# Patient Record
Sex: Female | Born: 1975 | Race: White | Hispanic: No | Marital: Married | Smoking: Never smoker
Health system: Southern US, Academic
[De-identification: ages and names within clinical notes are randomized; demographics above are authoritative.]

## PROBLEM LIST (undated history)

## (undated) DIAGNOSIS — C189 Malignant neoplasm of colon, unspecified: Secondary | ICD-10-CM

## (undated) DIAGNOSIS — I4891 Unspecified atrial fibrillation: Secondary | ICD-10-CM

## (undated) HISTORY — PX: HX HEMICOLECTOMY: 2100001145

## (undated) HISTORY — DX: Malignant neoplasm of colon, unspecified: C18.9

## (undated) HISTORY — PX: HX COLONOSCOPY: 2100001147

## (undated) HISTORY — DX: Unspecified atrial fibrillation: I48.91

## (undated) HISTORY — PX: DILATION AND CURETTAGE, DIAGNOSTIC / THERAPEUTIC: SUR384

## (undated) HISTORY — PX: HX BACK SURGERY: SHX140

---

## 1998-09-02 ENCOUNTER — Other Ambulatory Visit (HOSPITAL_COMMUNITY): Payer: Self-pay

## 2021-01-07 IMAGING — CT CT THORAX WITH CONTRAST
2 of 3 series · 16 of 36 positions shown, 20 images · IV contrast (CONTRAST)
Comparison: 01/06/2018

﻿EXAM:  [id number]   CT THORAX WITH CONTRAST
INDICATION: Lung nodule.
TECHNIQUE: CT of the chest was performed with 75 mL Optiray 320.   Exam was performed using one or more of the following dose reduction techniques: Automated exposure control, adjustment of the mA and/or kV according to patient size, or the use of iterative reconstruction technique. Radiation dose 591 mGy cm.

[Series 8028: ax post · axial · 0.72mm/px · z∈[-379,-323]mm · 13 of 33 slices shown, 17 images]
[im 3/33  mediastinal]
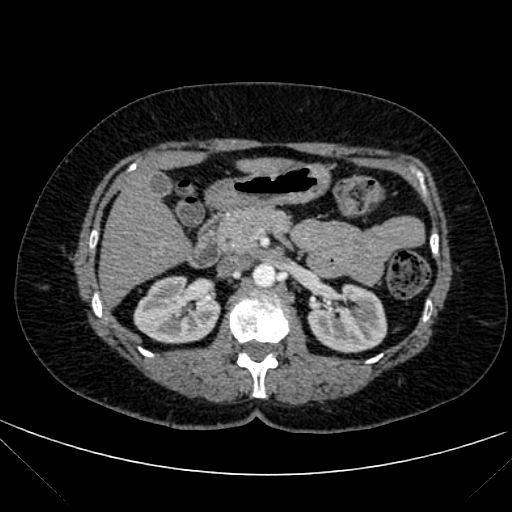
[im 3/33  lung]
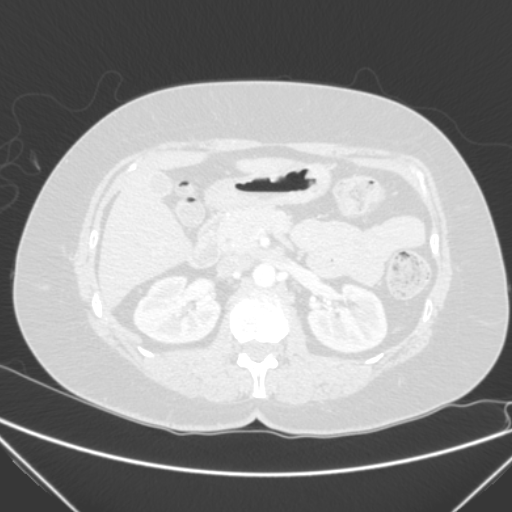
[im 5/33  lung]
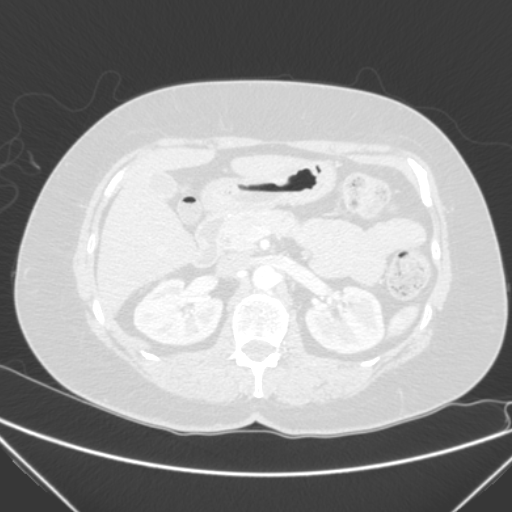
[im 8/33  lung]
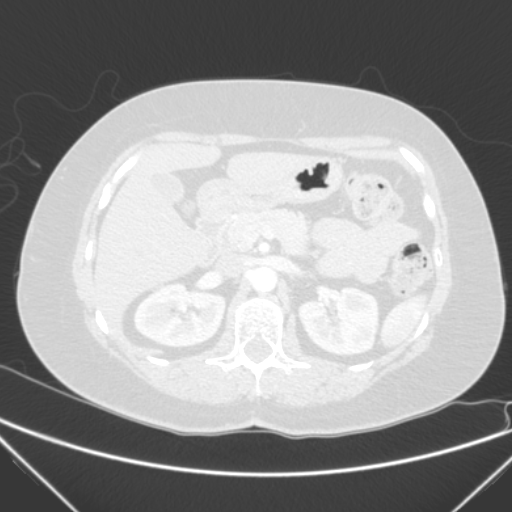
[im 10/33  lung]
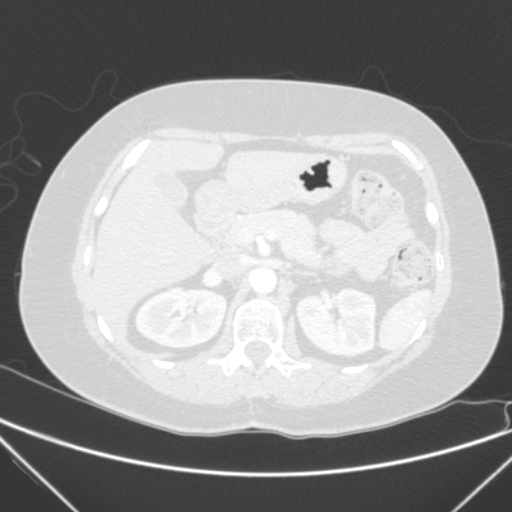
[im 12/33  mediastinal]
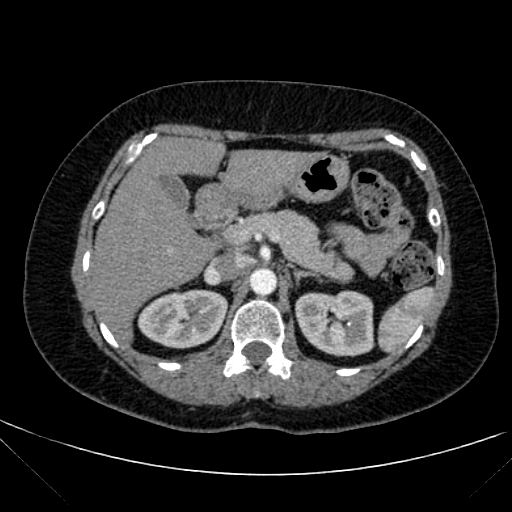
[im 12/33  lung]
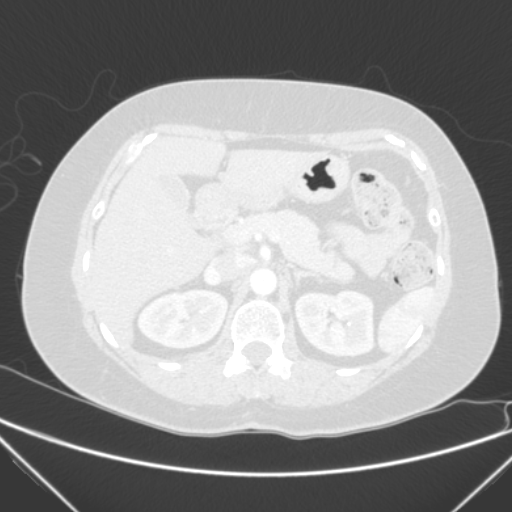
[im 15/33  lung]
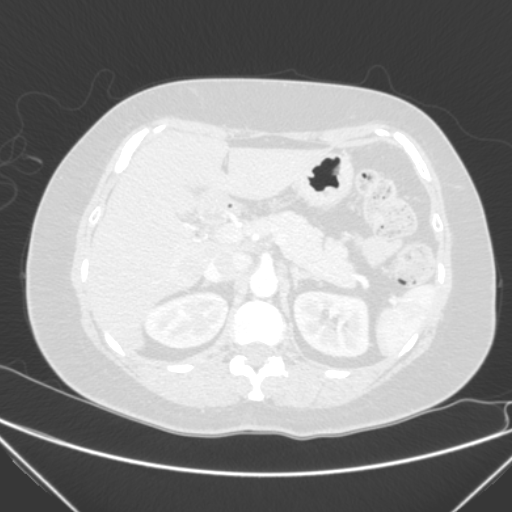
[im 17/33  lung]
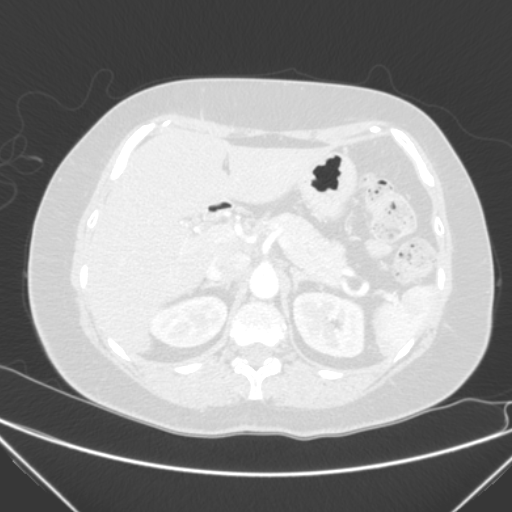
[im 19/33  lung]
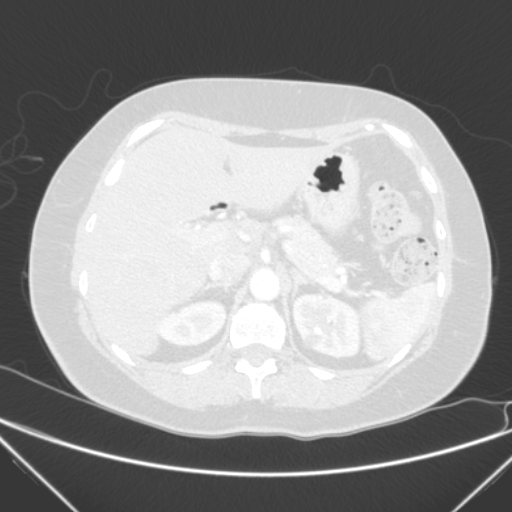
[im 22/33  mediastinal]
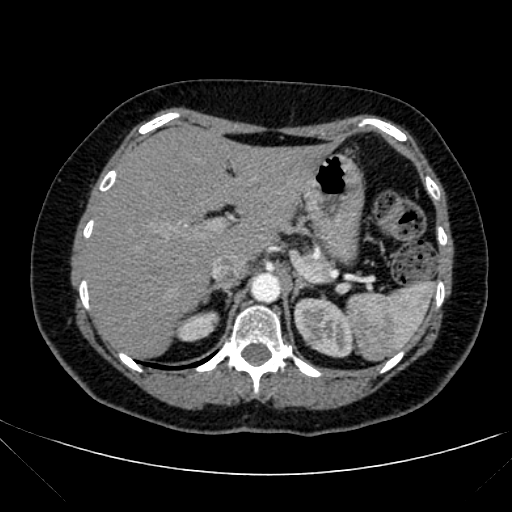
[im 22/33  lung]
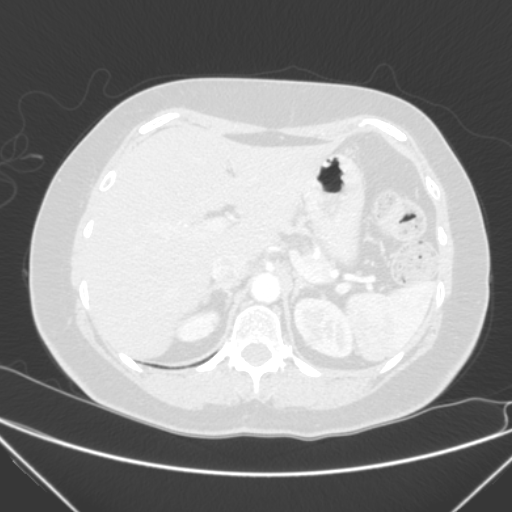
[im 24/33  lung]
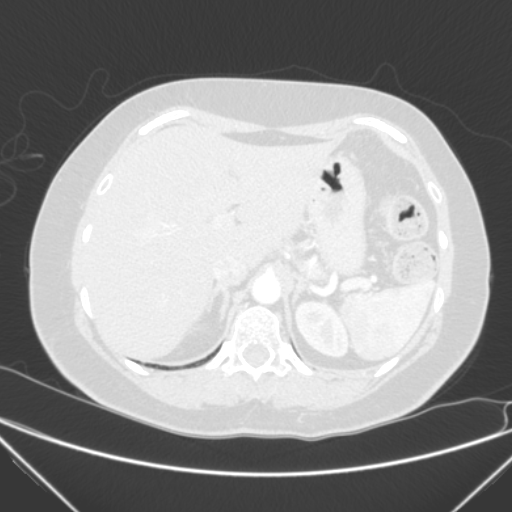
[im 27/33  lung]
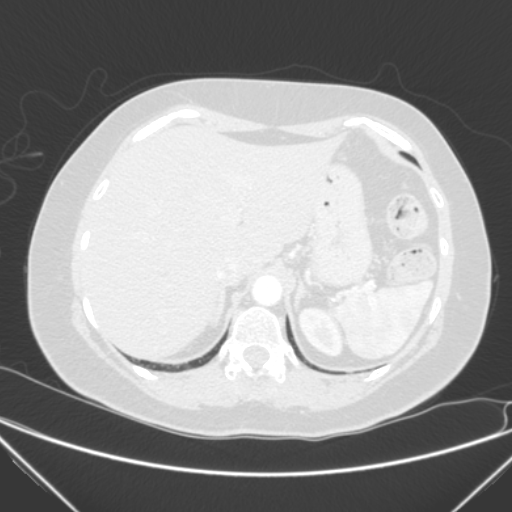
[im 29/33  lung]
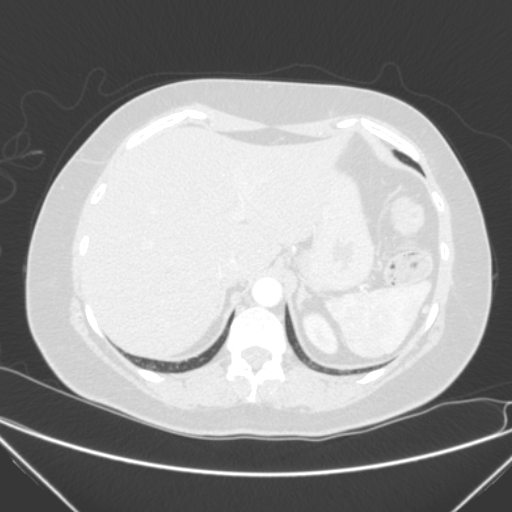
[im 31/33  mediastinal]
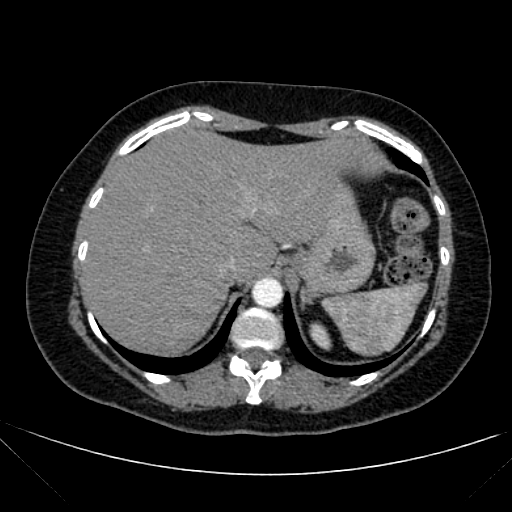
[im 31/33  lung]
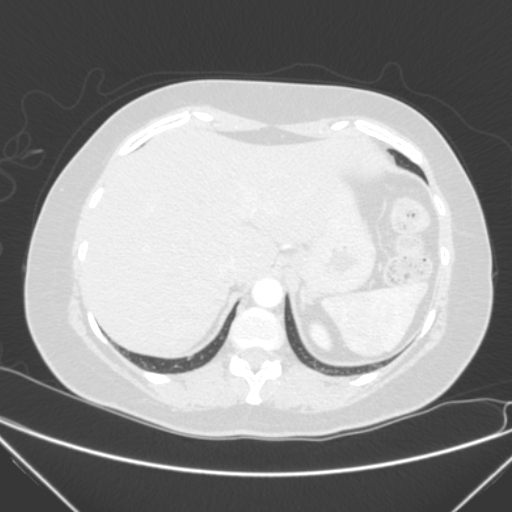

[Series 8029: cor · coronal · 0.71mm/px · 3 of 79 slices shown]
[im 16/79  lung]
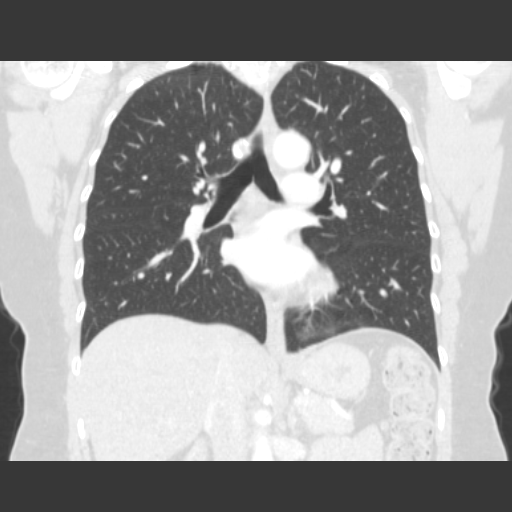
[im 32/79  lung]
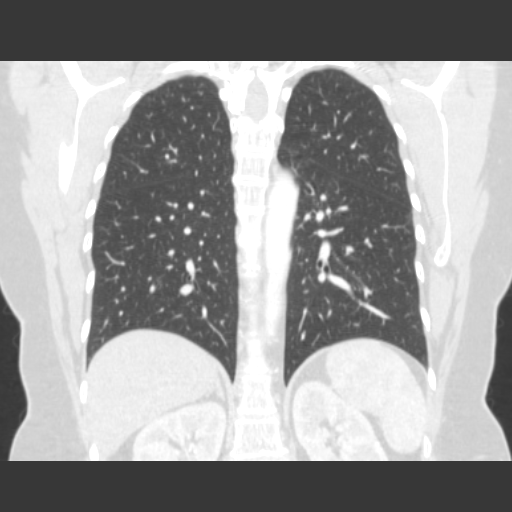
[im 47/79  lung]
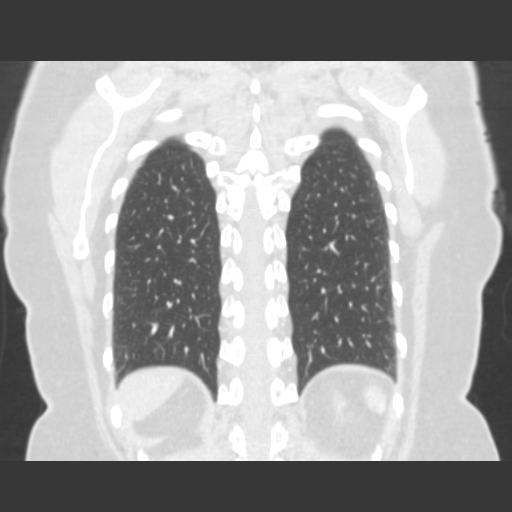

[16 of 36 positions shown; findings below may reference images not displayed]

FINDINGS: The lungs appear essentially clear with no infiltrates or significant pulmonary nodules.  

There is no pleural or pericardial fluid.  The heart size is within normal limits.  There is no lymph node enlargement.  The aorta is of normal caliber.
IMPRESSION: Essentially unremarkable examination.

## 2021-07-02 IMAGING — MR MRI LUMBAR SPINE WITHOUT CONTRAST
4 of 6 series · 31 of 48 positions shown · IV contrast (gadolinium)
Comparison: CT abdomen pelvis dated 01/06/2018.

﻿EXAM:  [id number]   MRI LUMBAR SPINE WITHOUT CONTRAST
INDICATION: Lower back pain with right lower extremity radiculopathy.
TECHNIQUE: Multiplanar multisequential MRI of the lumbar spine was performed without gadolinium contrast.

[Series 5: T2 · sagittal · 4.0mm · 0.94mm/px · 6 of 13 slices shown (1 of 3)]
[im 1/13]
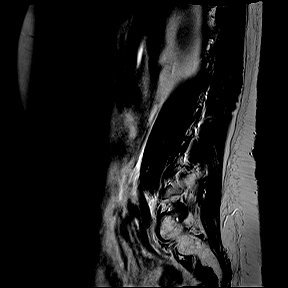
[im 3/13]
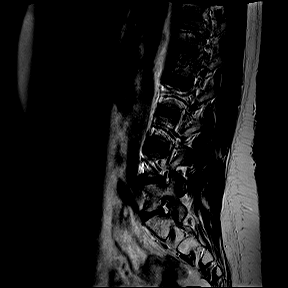
[im 5/13]
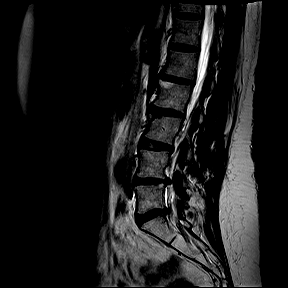
[im 8/13]
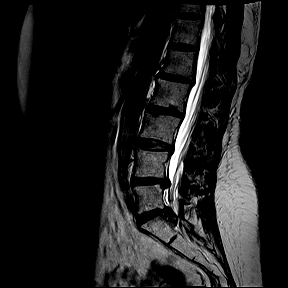
[im 10/13]
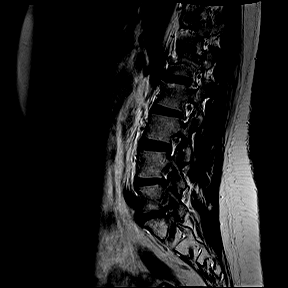
[im 13/13]
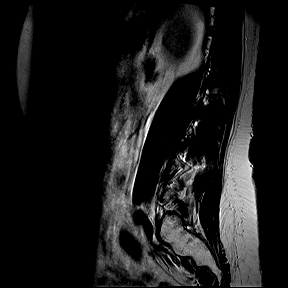

[Series 6: T1 · sagittal · 4.0mm · 0.94mm/px · 6 of 13 slices shown]
[im 1/13]
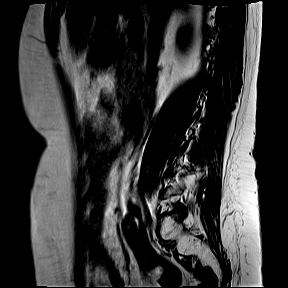
[im 3/13]
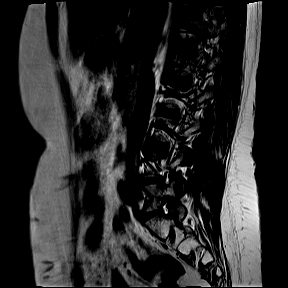
[im 5/13]
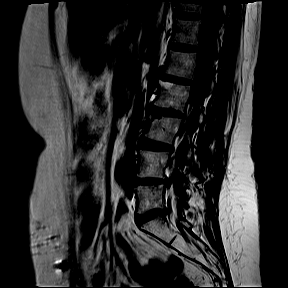
[im 8/13]
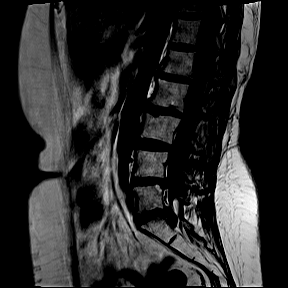
[im 10/13]
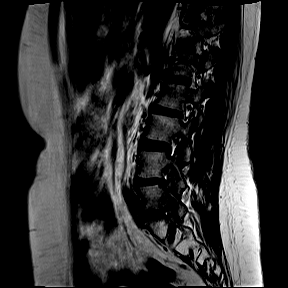
[im 13/13]
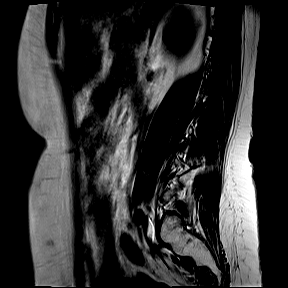

[Series 8: T2 · axial · 4.0mm · 0.52mm/px · z∈[-65,+131]mm · 11 of 26 slices shown (2 of 3)]
[im 1/26]
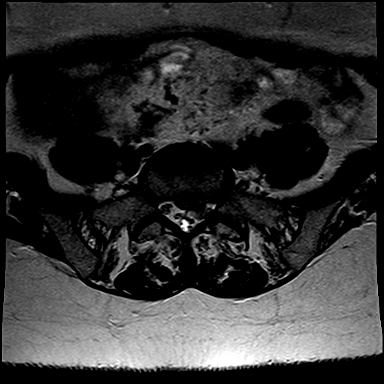
[im 3/26]
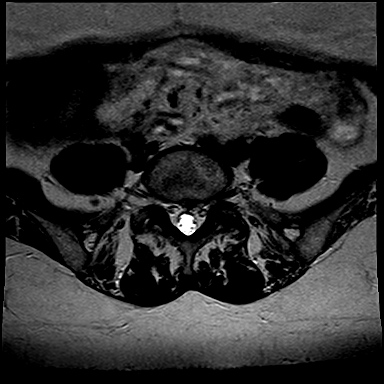
[im 6/26]
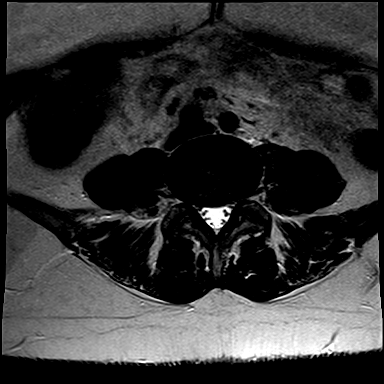
[im 8/26]
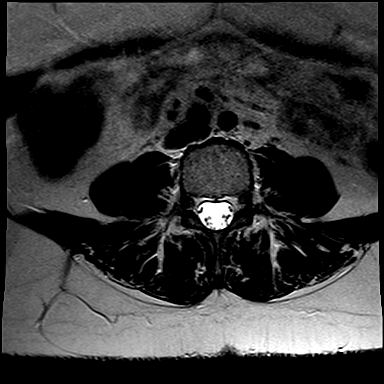
[im 11/26]
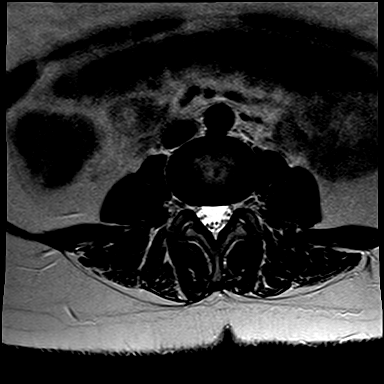
[im 13/26]
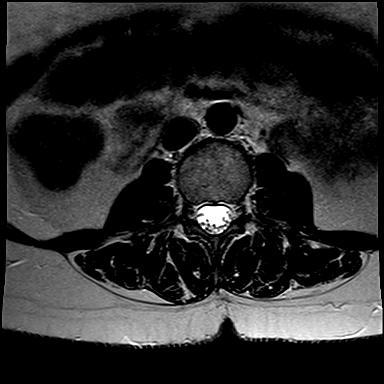
[im 16/26]
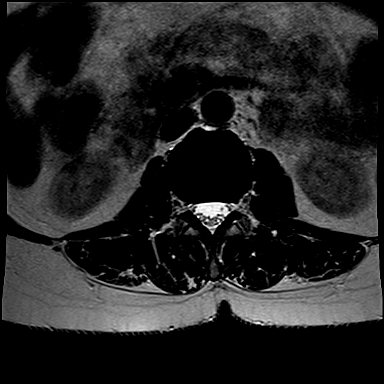
[im 18/26]
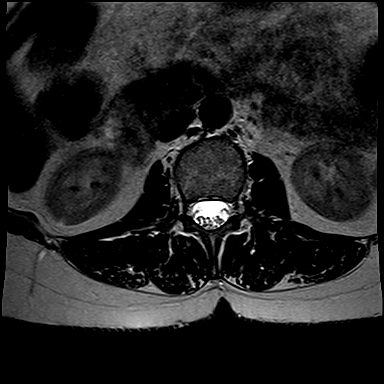
[im 21/26]
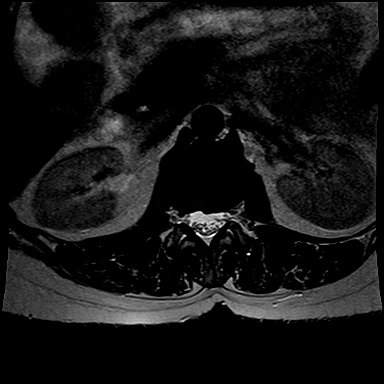
[im 23/26]
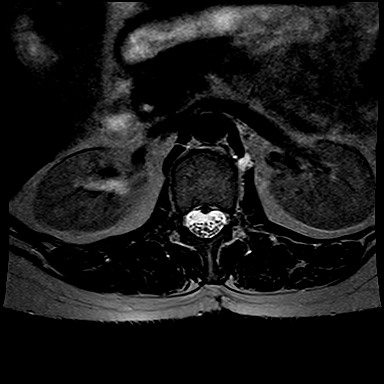
[im 26/26]
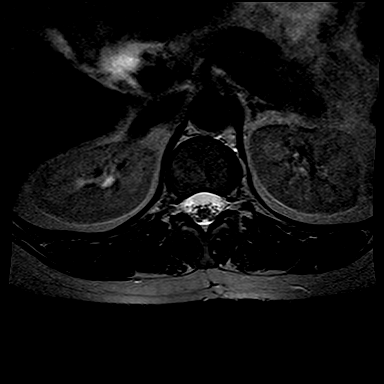

[Series 10: T2 · coronal · 5.0mm · 0.82mm/px · 8 of 18 slices shown (3 of 3)]
[im 1/18]
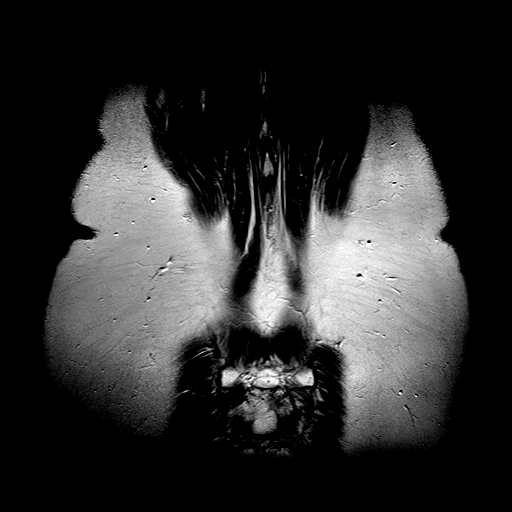
[im 3/18]
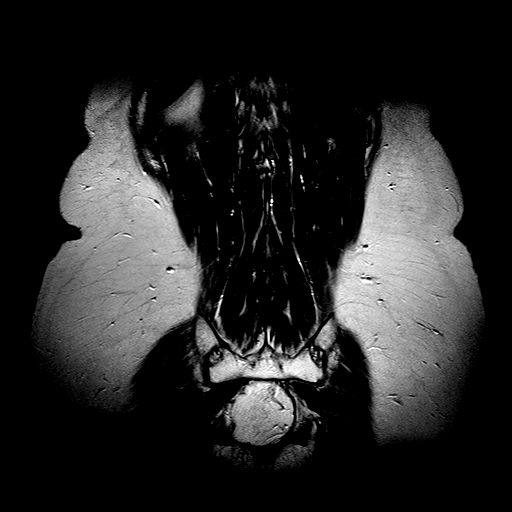
[im 5/18]
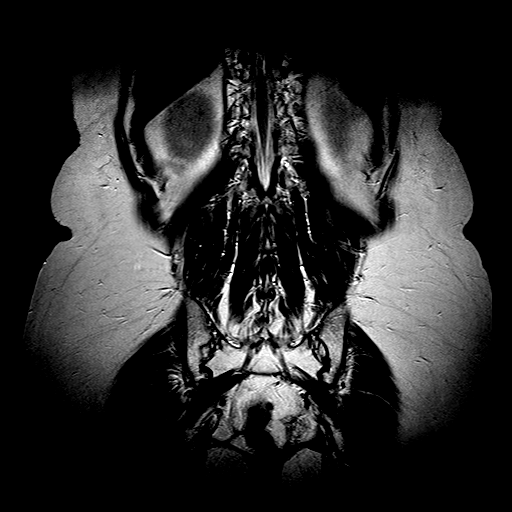
[im 8/18]
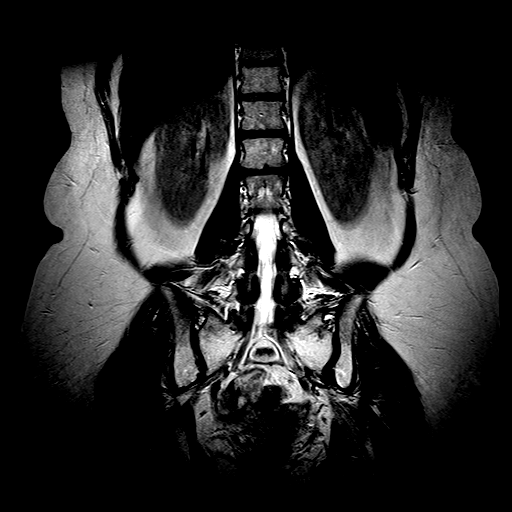
[im 10/18]
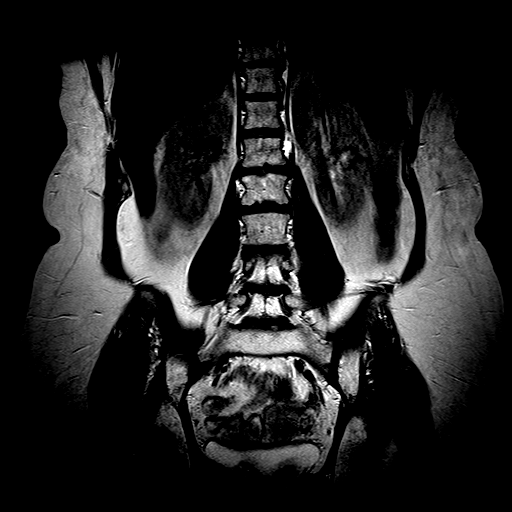
[im 13/18]
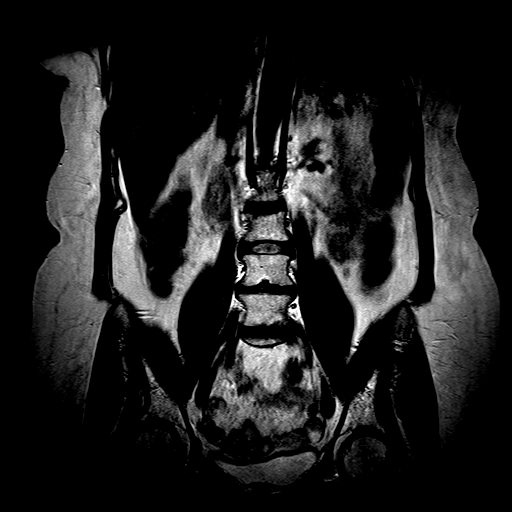
[im 15/18]
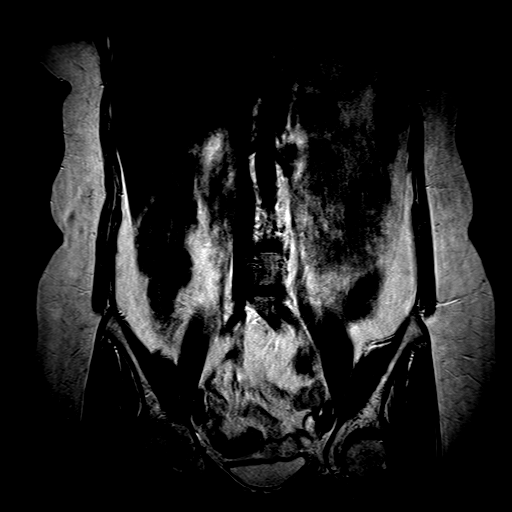
[im 18/18]
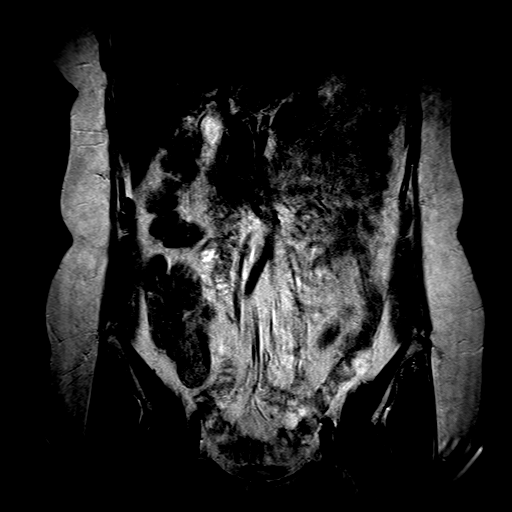

[31 of 48 positions shown; findings below may reference images not displayed]

FINDINGS: Bone marrow signal intensity is normal. There is no acute fracture or subluxation. Distal spinal cord is normal in signal intensity and terminates normally at T12-L1 disc space level. Spinal canal is congenitally narrow.

At L1-2 level, there is a small broad-based central and left paracentral disc bulge, mildly effacing the ventral thecal sac. There is no significant neural foraminal stenosis.

L2-3 level is unremarkable.

At L3-4 level, there is a small broad-based central disc bulge, mildly effacing the ventral thecal sac. There is mild bilateral neural foraminal stenosis from facet arthropathy and bulging annulus.

At L4-5 level, there is minimal retrolisthesis of L4 on L5 vertebral body. There is a small broad-based central disc bulge, mildly effacing the ventral thecal sac. There is moderate bilateral neural foraminal stenosis from facet arthropathy and bulging annulus.

At L5-S1 level, there is a moderate broad-based central and left paracentral disc bulge resulting in moderate left lateral recess compression. There is no mass effect on the thecal sac. There is also a 10 mm left paracentral extruded disc fragment extending inferiorly from the disc space and resulting in severe compression of the left S1 nerve root.

Paraspinal soft tissues are unremarkable.
IMPRESSION: 1. Minimal retrolisthesis of L4 on L5 vertebral body. 

2. Moderate left lateral recess compression at L5-S1 level from a moderate central and left paracentral disc bulge. A 10 mm left paracentral extruded disc fragment is also seen extending inferiorly at this level and resulting in severe left S1 nerve root compression. 

3. Multilevel neural foraminal stenosis as detailed above.

## 2021-09-25 NOTE — H&P (Signed)
Shirley Hudson  [id number]  1976/10/08   09/28/2021       Department of Hematology/Oncology  Return Patient Visit           REFERRING PROVIDER:  Loran Senters, MD  No address on file      REASON FOR OFFICE VISIT:  Ongoing management and evaluation of  Colon cancer      HISTORY OF PRESENT ILLNESS:  Shirley Hudson is a 45 y.o. female who presents to today alone for evaluation of colon cancer.  Review of Dr. Pierre Bali initial office consultation note dated April 01, 2016 reveals that the patient has been having intermittent hematochezia approximally for 2 years prior to diagnosis.  She was initially felt that this was secondary to hemorrhoids from the birth of her last child.  Unfortunately over the 6 months prior to diagnosis the bleeding became more frequent with greater volume.  Colonoscopy was performed and a polyp was noted in the rectum.  Biopsy revealed adeno carcinoma.  There was no evidence for metastatic disease.  She went for resection of disease on Mar 04, 2016 and underwent a low anterior resection.  Three to cancer was a T1 lesion and measured approximately 2.2 cm in size.  Was an adeno carcinoma and felt to be low grade.  Seventeen lymph nodes were removed and 1 contain metastatic cancer.  T1N1aM0 lesion.  Patient was treated with adjuvant FOLFOX therapy.  Patient had an abnormal mammogram and then had a Ultrasound of the lesion.  She states that it looked ok but this was through radiologist.  She continues to have issues with pain in the right breast and then pain in the right axilla.      She also had back surgery sent she has seen me.  She had an MRI and it showed a Fragment of a disc on the nerve.  She had surgery in [place] to remove this.   She has numbness in the left foot and the  Left upper thigh.   She follow up with the surgeon this month.   Denies fever, chills, diaphoresis, night sweats, and malaise. No infections during the interim.     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    HENT:  Negative.    Eyes: Negative.    Respiratory: Negative.    Cardiovascular: Negative.    Gastrointestinal: Negative.    Endocrine: Negative.    Genitourinary: Negative.     Musculoskeletal: Negative.    Skin: Negative.    Neurological: Negative.    Hematological: Negative.    Psychiatric/Behavioral: Negative.         Past Medical History:   Diagnosis Date    Afib (CMS HCC)     Malignant neoplasm of colon (CMS HCC)            Social History     Socioeconomic History    Marital status: Married     Spouse name: Not on file    Number of children: Not on file    Years of education: Not on file    Highest education level: Not on file   Occupational History    Not on file  Tobacco Use    Smoking status: Never    Smokeless tobacco: Never   Vaping Use    Vaping Use: Never used   Substance and Sexual Activity    Alcohol use: Never    Drug use: Never    Sexual activity: Not on file   Other Topics Concern    Not on file   Social History Narrative    Not on file     Social Determinants of Health     Financial Resource Strain: Not on file   Food Insecurity: Not on file   Transportation Needs: Not on file   Physical Activity: Not on file   Stress: Not on file   Intimate Partner Violence: Not on file   Housing Stability: Not on file       Social History     Social History Narrative    Not on file       Social History     Substance and Sexual Activity   Drug Use Never       Family Medical History:     Problem Relation (Age of Onset)    Lung Cancer Father, Maternal Grandfather    Throat cancer Paternal Grandfather            Current Outpatient Medications   Medication Sig    ALPRAZolam (XANAX) 0.5 mg Oral Tablet     bethanechol  chloride (URECHOLINE) 5 mg Oral Tablet     busPIRone (BUSPAR) 10 mg Oral Tablet     cetirizine (ZYRTEC) 10 mg Oral Tablet     cyanocobalamin (VITAMIN B12) 1,000 mcg/mL Injection Solution     Epinastine 0.05 % Ophthalmic Drops     ergocalciferol, vitamin D2, (DRISDOL) 1,250 mcg (50,000 unit) Oral Capsule     famotidine (PEPCID) 40 mg Oral Tablet     fluticasone propionate (FLONASE) 50 mcg/actuation Nasal Spray, Suspension     gabapentin (NEURONTIN) 100 mg Oral Capsule     HYDROcodone-acetaminophen (NORCO) 5-325 mg Oral Tablet     LINZESS 145 mcg Oral Capsule     meloxicam (MOBIC) 7.5 mg Oral Tablet     methocarbamoL (ROBAXIN) 500 mg Oral Tablet     metoprolol succinate (TOPROL-XL) 25 mg Oral Tablet Sustained Release 24 hr     montelukast (SINGULAIR) 10 mg Oral Tablet     pravastatin (PRAVACHOL) 20 mg Oral Tablet     traMADoL (ULTRAM) 50 mg Oral Tablet        No Known Allergies      PHYSICAL EXAM:  BP 137/66 (Site: Left, Patient Position: Sitting, Cuff Size: Adult)    Pulse 75    Temp 36.9 C (98.4 F) (Oral)    Ht 1.676 m (5\' 6" )    Wt 75.3 kg (166 lb)    BMI 26.79 kg/m        ECOG Status: (0) Fully active, able to carry on all predisease performance without restriction   Physical Exam  Vitals reviewed.   Constitutional:       Appearance: Normal appearance. She is normal weight.   HENT:      Head: Normocephalic.   Eyes:      Conjunctiva/sclera: Conjunctivae normal.      Pupils: Pupils are equal, round, and reactive to light.   Neck:      Thyroid: No thyroid mass, thyromegaly or thyroid tenderness.   Cardiovascular:      Rate and Rhythm: Normal rate and regular rhythm.      Pulses: Normal pulses.  Heart sounds: Normal heart sounds, S1 normal and S2 normal. No murmur heard.    No S3 or S4 sounds.   Pulmonary:      Effort: Pulmonary effort is normal.      Breath sounds: Normal breath sounds.   Abdominal:      General: Bowel sounds are normal.      Palpations: Abdomen is soft.   Musculoskeletal:          General: Normal range of motion.      Cervical back: Normal range of motion and neck supple.      Right lower leg: No edema.      Left lower leg: No edema.   Lymphadenopathy:      Head:      Right side of head: No submental, submandibular, preauricular, posterior auricular or occipital adenopathy.      Left side of head: No submental, submandibular, preauricular, posterior auricular or occipital adenopathy.      Cervical: No cervical adenopathy.      Right cervical: No superficial, deep or posterior cervical adenopathy.     Left cervical: No superficial, deep or posterior cervical adenopathy.      Upper Body:      Right upper body: No supraclavicular or axillary adenopathy.      Left upper body: No supraclavicular or axillary adenopathy.      Lower Body: No right inguinal adenopathy. No left inguinal adenopathy.   Skin:     General: Skin is warm and dry.      Capillary Refill: Capillary refill takes less than 2 seconds.   Neurological:      General: No focal deficit present.      Mental Status: She is alert and oriented to person, place, and time. Mental status is at baseline.      Motor: Motor function is intact.      Coordination: Coordination is intact.      Gait: Gait is intact.   Psychiatric:         Attention and Perception: Attention and perception normal.         Mood and Affect: Mood and affect normal.         Speech: Speech normal.         Behavior: Behavior normal. Behavior is cooperative.         Thought Content: Thought content normal.         Cognition and Memory: Cognition and memory normal.         Judgment: Judgment normal.            RADIOLOGY:  06/08/21 mammogram somewhat spiculated mass in the left lower inner retro areolar region.  There is also a circumscribed benign appearing right 6 o'clock mid depth nodule new from previous    06/17/21 left breast ultrasound shows a probable benign 5 mm parallel nodule at 8 o'clock 6 cm from the nipple    LABS:   04/30/21  WBC 5.9, Hgb 12.7, Hct 39.5, Plt  270,000  CEA 1.3    ASSESSMENT:    ICD-10-CM    1. Rectal cancer (CMS HCC)  C20 CBC/DIFF     COMPREHENSIVE METABOLIC PANEL, NON-FASTING     CARCINOEMBRYONIC ANTIGEN     Refer to Gen Surg-Dr. Mallie Mussel      2. Abnormal mammogram of both breasts  R92.8            PLAN:   1. All relative external and internal medical records were reviewed from Dr.  Schor's office including available H&Ps, progress notes, procedure notes, imaging's, laboratories, and pathology.  2. Patient was given lab order today for CBC, CMP, and CEA.  Given her history of colon cancer and this abnormal mammogram patient would like to see Dr. Mallie Mussel due to the abnormal mammogram.  I will make this referral.  I will see her in 3 months sooner if needed  Return in about 3 months (around 12/27/2021).     Shirley Hudson was given the chance to ask questions, and these were answered to their satisfaction. The patient is welcome to call with any questions or concerns in the meantime.     On the day of the encounter, I spent a total of  31 minutes on this patient encounter including review of historical information, examination, documentation and post-visit activities.     Patrcia Dolly, APRN,FNP-BC  09/25/2021, 12:28     This note was partially generated using MModal Fluency Direct system, and there may be some incorrect words, spellings, and punctuation that were not noted in checking the note before saving.     CC:  Zadie Rhine, [place]  [place] [address]

## 2021-09-28 ENCOUNTER — Ambulatory Visit (INDEPENDENT_AMBULATORY_CARE_PROVIDER_SITE_OTHER): Payer: Medicare Other | Admitting: NURSE PRACTITIONER

## 2021-09-28 ENCOUNTER — Encounter (INDEPENDENT_AMBULATORY_CARE_PROVIDER_SITE_OTHER): Payer: Self-pay | Admitting: NURSE PRACTITIONER

## 2021-09-28 ENCOUNTER — Other Ambulatory Visit: Payer: Self-pay

## 2021-09-28 VITALS — BP 137/66 | HR 75 | Temp 98.4°F | Ht 66.0 in | Wt 166.0 lb

## 2021-09-28 DIAGNOSIS — C20 Malignant neoplasm of rectum: Secondary | ICD-10-CM

## 2021-09-28 DIAGNOSIS — R928 Other abnormal and inconclusive findings on diagnostic imaging of breast: Secondary | ICD-10-CM

## 2021-09-28 NOTE — Patient Instructions (Signed)
Please take the labs orders given today and go to the first floor registration.  Once registered you will be directed to the lab for completion.  If you choose you may take your labs to [facility] as well.  Please be aware that it does take longer for me to receive results from [place].  We will call you if needed once your labs are received. I encourage you to keep all your scheduled appointment with physicians.  Mammograms are recommended yearly.  Colonoscopy is recommended starting at age 52 unless symptoms for family history warrant sooner.  Patient with colon cancer screening is based off of when initial diagnosis was and most recent colonoscopy findings.  Immunization recommendations are based on age.   I look forward to seeing you for your next follow up in 3 months.  Please call if you have any questions or concerns.      If you have not already signed up for  [place], I strongly encourage you to do so.   It will allow to you message [place] with questions, schedule and reschedule appointments. Once the hospital goes to [facility] in March of 2023 it will allow you to see your labs and imaging as well.  If you have not given [place] your e-mail address and you want to sign up for this service, please give check in or check out your e-mail.  You will receive an e-mail and a link to sign up and then will need to download the app on your phone.    Our office number is [phone].  You will be directed to the call center and then a message will be sent to our office.  If you are awaiting a call back from [place] please make sure to answer in toll free call numbers, as that is currently what the phone ID shows when we call from our office.      Please be aware that when you are here in the office and we are a [facility] facility.  The hospital is not  a [facility] facility at this time and if you need a port flush or treatment you will have to be registered and scheduled with [facility].    If you are on the [facility] outpatient Oncology side for treatment or  port flush and have not seen the provider on that day, we do not know that you are here, as much as we would like to keep up with everyone we just cannot, and [facility] schedule is not our schedule.  If you have a problem while you are in the infusion center you will need to speak with the nurse who is caring for you. They will get in touch with [place].  Please do not stop [place] in the hall, as we are trying to see patients in a timely manner.      Thank you for allowing [place] to be part of your healthcare team.

## 2021-10-07 ENCOUNTER — Encounter (INDEPENDENT_AMBULATORY_CARE_PROVIDER_SITE_OTHER): Payer: Self-pay | Admitting: NURSE PRACTITIONER

## 2021-10-14 ENCOUNTER — Encounter (INDEPENDENT_AMBULATORY_CARE_PROVIDER_SITE_OTHER): Payer: Self-pay | Admitting: NURSE PRACTITIONER

## 2021-10-14 NOTE — Telephone Encounter (Signed)
Opened in error

## 2021-10-26 ENCOUNTER — Encounter (INDEPENDENT_AMBULATORY_CARE_PROVIDER_SITE_OTHER): Payer: Self-pay | Admitting: NURSE PRACTITIONER

## 2022-01-04 ENCOUNTER — Encounter (INDEPENDENT_AMBULATORY_CARE_PROVIDER_SITE_OTHER): Payer: Self-pay | Admitting: NURSE PRACTITIONER

## 2022-02-18 ENCOUNTER — Other Ambulatory Visit (HOSPITAL_COMMUNITY): Payer: Medicare Other

## 2022-02-18 ENCOUNTER — Other Ambulatory Visit: Payer: Self-pay

## 2022-02-18 ENCOUNTER — Ambulatory Visit: Payer: Medicare Other | Attending: HEMATOLOGY-ONCOLOGY | Admitting: HEMATOLOGY-ONCOLOGY

## 2022-02-18 ENCOUNTER — Encounter (HOSPITAL_COMMUNITY): Payer: Self-pay | Admitting: HEMATOLOGY-ONCOLOGY

## 2022-02-18 VITALS — BP 117/92 | HR 67 | Temp 97.7°F | Ht 66.0 in | Wt 168.1 lb

## 2022-02-18 DIAGNOSIS — C20 Malignant neoplasm of rectum: Secondary | ICD-10-CM

## 2022-02-18 DIAGNOSIS — Z85038 Personal history of other malignant neoplasm of large intestine: Secondary | ICD-10-CM | POA: Insufficient documentation

## 2022-02-18 DIAGNOSIS — Z801 Family history of malignant neoplasm of trachea, bronchus and lung: Secondary | ICD-10-CM | POA: Insufficient documentation

## 2022-02-18 DIAGNOSIS — Z802 Family history of malignant neoplasm of other respiratory and intrathoracic organs: Secondary | ICD-10-CM | POA: Insufficient documentation

## 2022-02-18 DIAGNOSIS — N6489 Other specified disorders of breast: Secondary | ICD-10-CM

## 2022-02-18 DIAGNOSIS — Z08 Encounter for follow-up examination after completed treatment for malignant neoplasm: Secondary | ICD-10-CM | POA: Insufficient documentation

## 2022-02-18 LAB — COMPREHENSIVE METABOLIC PANEL, NON-FASTING
ALBUMIN/GLOBULIN RATIO: 1.5 — ABNORMAL HIGH (ref 0.8–1.4)
ALBUMIN: 4.7 g/dL (ref 3.5–5.7)
ALKALINE PHOSPHATASE: 85 U/L (ref 34–104)
ALT (SGPT): 16 U/L (ref 7–52)
ANION GAP: 8 mmol/L — ABNORMAL LOW (ref 10–20)
AST (SGOT): 18 U/L (ref 13–39)
BILIRUBIN TOTAL: 0.6 mg/dL (ref 0.3–1.2)
BUN/CREA RATIO: 16 (ref 6–22)
BUN: 12 mg/dL (ref 7–25)
CALCIUM, CORRECTED: 9.4 mg/dL (ref 8.9–10.8)
CALCIUM: 10.1 mg/dL (ref 8.6–10.3)
CHLORIDE: 103 mmol/L (ref 98–107)
CO2 TOTAL: 29 mmol/L (ref 21–31)
CREATININE: 0.75 mg/dL (ref 0.60–1.30)
ESTIMATED GFR: 99 mL/min/{1.73_m2} (ref >59)
GLOBULIN: 3.1 (ref 2.9–5.4)
GLUCOSE: 90 mg/dL (ref 74–109)
OSMOLALITY, CALCULATED: 279 mOsm/kg (ref 270–290)
POTASSIUM: 4.2 mmol/L (ref 3.5–5.1)
PROTEIN TOTAL: 7.8 g/dL (ref 6.4–8.9)
SODIUM: 140 mmol/L (ref 136–145)

## 2022-02-18 LAB — CBC WITH DIFF
BASOPHIL #: 0.1 10*3/uL (ref 0.00–0.30)
BASOPHIL %: 1 % (ref 0–3)
EOSINOPHIL #: 0.1 10*3/uL (ref 0.00–0.80)
EOSINOPHIL %: 3 % (ref 0–7)
HCT: 39.3 % (ref 37.0–47.0)
HGB: 13.4 g/dL (ref 12.5–16.0)
LYMPHOCYTE #: 1.5 10*3/uL (ref 1.10–5.00)
LYMPHOCYTE %: 33 % (ref 25–45)
MCH: 29.2 pg (ref 27.0–32.0)
MCHC: 34.2 g/dL (ref 32.0–36.0)
MCV: 85.3 fL (ref 78.0–99.0)
MONOCYTE #: 0.4 10*3/uL (ref 0.00–1.30)
MONOCYTE %: 10 % (ref 0–12)
MPV: 8.2 fL (ref 7.4–10.4)
NEUTROPHIL #: 2.4 10*3/uL (ref 1.80–8.40)
NEUTROPHIL %: 53 % (ref 40–76)
PLATELETS: 307 10*3/uL (ref 140–440)
RBC: 4.61 10*6/uL (ref 4.20–5.40)
RDW: 13.2 % (ref 11.6–14.8)
WBC: 4.6 10*3/uL (ref 4.0–10.5)
WBCS UNCORRECTED: 4.6 10*3/uL

## 2022-02-18 LAB — CARCINOEMBRYONIC ANTIGEN: CEA: 1 ng/mL (ref <=5.0)

## 2022-02-18 NOTE — Patient Instructions (Signed)
Continue current medications.     Lab orders placed for today.

## 2022-02-18 NOTE — Progress Notes (Addendum)
Department of Hematology/Oncology  Progress Note   Name: Shirley Hudson  FVC:[id number]  Date of Birth: 05/15/1976  Encounter Date: 02/18/2022    REFERRING PROVIDER:  Zadie Rhine, [address]  [place],  [place] [address]     TELEMEDICINE DOCUMENTATION:  Patient Location:  [facility], [facility] outpatient Hematology/Oncology [address], [place] [address]  Patient/family aware of provider location:  yes  Patient/family consent for telemedicine:  yes  Examination observed and performed by:  Patrcia Dolly APRN, FNP-BC, AOCNP    REASON FOR OFFICE VISIT:  Rectal Cancer     HISTORY OF PRESENT ILLNESS:  Shirley Hudson is a 46 y.o. female who presents today for follow up of rectal cancer. She was originally diagnosed in 2017, and states that hematochezia had been present for approximately the prior 2 years. In May 2017 she underwent an LAR surgery and was found to have a 2.2 cm primary lesion. 17 lymph nodes were removed and 1 was found to be involved. She was therefore stage III (T1N1aM0) and received adjuvant FOLFOX chemotherapy.     She has also had a couple of breast lesions that have been monitored. She states that there has been some discussion about whether to biopsy at least one of them. She says that she has follow up in the near future for that issue.     ROS:   Pertinent review of systems as discussed in HPI    HISTORY:  Past Medical History:   Diagnosis Date    Afib (CMS HCC)     Malignant neoplasm of colon (CMS HCC)          Past Surgical History:   Procedure Laterality Date    CESAREAN SECTION      DILATION AND CURETTAGE, DIAGNOSTIC / THERAPEUTIC      HX BACK SURGERY      HX COLONOSCOPY      HX HEMICOLECTOMY           Social History     Socioeconomic History    Marital status: Married     Spouse name: Not on file    Number of children: Not on file    Years of education: Not on file    Highest education level: Not on file   Occupational History    Not on file   Tobacco Use    Smoking  status: Never    Smokeless tobacco: Never   Vaping Use    Vaping Use: Never used   Substance and Sexual Activity    Alcohol use: Never    Drug use: Never    Sexual activity: Not on file   Other Topics Concern    Not on file   Social History Narrative    Not on file     Social Determinants of Health     Financial Resource Strain: Not on file   Transportation Needs: Not on file   Social Connections: Not on file   Intimate Partner Violence: Not on file   Housing Stability: Not on file     Family Medical History:       Problem Relation (Age of Onset)    Lung Cancer Father, Maternal Grandfather    Throat cancer Paternal Grandfather            Current Outpatient Medications   Medication Sig    albuterol sulfate (PROVENTIL OR VENTOLIN OR PROAIR) 90 mcg/actuation Inhalation oral inhaler ProAir HFA 90 mcg/actuation aerosol inhaler  ALPRAZolam (XANAX) 0.5 mg Oral Tablet     AUVELITY 45-105 mg Oral tablet, IR & ER, biphasic     bethanechol chloride (URECHOLINE) 5 mg Oral Tablet     busPIRone (BUSPAR) 10 mg Oral Tablet     cetirizine (ZYRTEC) 10 mg Oral Tablet     cyanocobalamin (VITAMIN B12) 1,000 mcg/mL Injection Solution     Epinastine 0.05 % Ophthalmic Drops     ergocalciferol, vitamin D2, (DRISDOL) 1,250 mcg (50,000 unit) Oral Capsule     esomeprazole magnesium (NEXIUM) 40 mg Oral Capsule, Delayed Release(E.C.)     famotidine (PEPCID) 40 mg Oral Tablet     fluticasone propionate (FLONASE) 50 mcg/actuation Nasal Spray, Suspension     gabapentin (NEURONTIN) 300 mg Oral Capsule     HYDROcodone-acetaminophen (NORCO) 5-325 mg Oral Tablet     LINZESS 145 mcg Oral Capsule     meloxicam (MOBIC) 7.5 mg Oral Tablet     methocarbamoL (ROBAXIN) 500 mg Oral Tablet     metoprolol succinate (TOPROL-XL) 25 mg Oral Tablet Sustained Release 24 hr     montelukast (SINGULAIR) 10 mg Oral Tablet     olopatadine (PATANOL) 0.1 % ophthalmic solution olopatadine 0.1 % eye drops    pantoprazole (PROTONIX) 40 mg Oral Tablet, Delayed Release (E.C.)  pantoprazole 40 mg tablet,delayed release    pravastatin (PRAVACHOL) 20 mg Oral Tablet     traMADoL (ULTRAM) 50 mg Oral Tablet      No Known Allergies    PHYSICAL EXAM:  Most Recent Vitals    Flowsheet Row Telemedicine from 02/18/2022 in Hematology/Oncology,   [facility]   Temperature 36.5 C (97.7 F) filed at... 02/18/2022 0934   Heart Rate 67 filed at... 02/18/2022 0934   Respiratory Rate --   BP (Non-Invasive) 117/92 filed at... 02/18/2022 0934   SpO2 --   Height 1.676 m ('5\' 6"' ) filed at... 02/18/2022 0934   Weight 76.2 kg (168 lb 1.6 oz) filed at... 02/18/2022 0934   BMI (Calculated) 27.19 filed at... 02/18/2022 0934   BSA (Calculated) 1.88 filed at... 02/18/2022 0934      ECOG Status: (0) Fully active, able to carry on all predisease performance without restriction   Physical Exam  Vitals reviewed.   Constitutional:       Appearance: Normal appearance. She is normal weight.   Neck:      Thyroid: No thyroid mass, thyromegaly or thyroid tenderness.   Cardiovascular:      Rate and Rhythm: Normal rate and regular rhythm.      Pulses: Normal pulses.      Heart sounds: Normal heart sounds, S1 normal and S2 normal. No murmur heard.    No S3 or S4 sounds.   Pulmonary:      Effort: Pulmonary effort is normal.      Breath sounds: Normal breath sounds.   Abdominal:      Palpations: Abdomen is soft.   Musculoskeletal:         General: Normal range of motion.      Cervical back: Normal range of motion.      Right lower leg: No edema.      Left lower leg: No edema.   Lymphadenopathy:      Head:      Right side of head: No submental, submandibular, tonsillar, preauricular, posterior auricular or occipital adenopathy.      Left side of head: No submental, submandibular, tonsillar, preauricular, posterior auricular or occipital adenopathy.      Cervical:  No cervical adenopathy.      Right cervical: No superficial, deep or posterior cervical adenopathy.     Left cervical: No superficial, deep or posterior  cervical adenopathy.      Upper Body:      Right upper body: No supraclavicular or axillary adenopathy.      Left upper body: No supraclavicular or axillary adenopathy.   Skin:     General: Skin is warm.   Neurological:      General: No focal deficit present.      Mental Status: She is alert and oriented to person, place, and time.      Motor: Motor function is intact.      Coordination: Coordination is intact.   Psychiatric:         Attention and Perception: Attention and perception normal.         Mood and Affect: Mood normal.         Speech: Speech normal.         Behavior: Behavior normal. Behavior is cooperative.         Thought Content: Thought content normal.         Cognition and Memory: Cognition normal.         Judgment: Judgment normal.         DIAGNOSTIC DATA:  No results found for this or any previous visit (from the past 17520 hour(s)).    LABS:   CBC  Diff   No results found for: WBC, WBCJ, HGB, HCT, PLTCNT, SEDRATE, ESR, RBC, MCV, MCHC, MCH, RDW, MPV No results found for: PMNS, LYMPHOCYTES, EOSINOPHIL, MONOCYTES, BASOPHILS, PMNABS, LYMPHSABS, EOSABS, MONOSABS, BASOSABS, BASABS         Comprehensive Metabolic Profile    No results found for: SODIUM, POTASSIUM, CHLORIDE, CO2, ANIONGAP, BUN, CREATININE, ALBUMIN, CALCIUM, GLUCOSENF, GLUCOSEFAST, ALKPHOS, ALT, AST, TOTBILIRUBIN, TOTALPROTEIN       BASIC METABOLIC PANEL  No results found for: SODIUM, POTASSIUM, CHLORIDE, CO2, ANIONGAP, BUN, CREATININE, BUNCRRATIO, GFR, CALCIUM, GLUCOSENF        ASSESSMENT:  Problem List Items Addressed This Visit    None  Visit Diagnoses       Rectal cancer (CMS [place])    -  Primary    Relevant Orders    COMPREHENSIVE METABOLIC PANEL, NON-FASTING    CBC/DIFF    CARCINOEMBRYONIC ANTIGEN    COMPREHENSIVE METABOLIC PANEL, NON-FASTING    CBC/DIFF    CARCINOEMBRYONIC ANTIGEN             ICD-10-CM    1. Rectal cancer (CMS HCC)  C20 COMPREHENSIVE METABOLIC PANEL, NON-FASTING     CBC/DIFF     CARCINOEMBRYONIC ANTIGEN      COMPREHENSIVE METABOLIC PANEL, NON-FASTING     CBC/DIFF     CARCINOEMBRYONIC ANTIGEN           PLAN:   1. All relevant medical records were reviewed including available pertinent provider notes, procedure notes, imaging, laboratory, and pathology.   2. All pertinent labs and/or imaging were reviewed with the patient.   3. Rectal cancer: She is more than 5 years out from that diagnosis, but retains a high level of concern. We will check labs today including a CEA level, and continue with 3 month follow ups at her request.  4. Breast lesions: She is following up with radiology for evaluation of that issue. A biopsy might be done in the near future and we may see her back sooner depending on those results.  Shirley Hudson was given the chance to ask questions, and these were answered to their satisfaction. The patient is welcome to call with any questions or concerns in the meantime.     The patient was seen as part of a collaborative telemedicine service with Dr. Jake Shark who participated in the encounter by active presence via approved video/audio means for portions of the encounter.  Dr. Jake Shark and Patrcia Dolly APRN, FNP-BC, AOCNP  discussed the physical assessment, reviewed and interpreted all studies and the plan was mutually developed.     On the day of the encounter,  Patrcia Dolly APRN, FNP-BC, AOCNP spent a total of 10 minutes independently on this patient encounter including review of historical information, face to face patient care, documentation and post-visit activities.     Patrcia Dolly APRN, FNP-BC, AOCNP, 02/18/2022, 10:50      On the day of the encounter, a total of 35 minutes was spent on this patient encounter including review of historical information, examination, documentation and post-visit activities.   Return in about 3 months (around 05/20/2022).     Narda Rutherford, MD  02/18/2022, 10:10    You can see your note(s) in [facility]. It is common for you to encounter certain medical terminology  which may be unfamiliar to you. You might see results before your provider does so please give at least 2 business days for review. Please have this understanding, that NOT all abnormal results are significant. Our office will contact you for any urgent or emergent action if necessary. If you have any questions or concerns, feel free to send a MyChart message or call the office. Please call with any new or concerning symptoms.     CC:  Zadie Rhine, [place] [id number]    Zadie Rhine, [address]  [place],  [place] [address]    This note was partially generated using MModal Fluency Direct system, and there may be some incorrect words, spellings, and punctuation that were not noted in checking the note before saving.

## 2022-02-24 ENCOUNTER — Encounter (HOSPITAL_COMMUNITY): Payer: Self-pay | Admitting: NURSE PRACTITIONER

## 2022-03-04 ENCOUNTER — Inpatient Hospital Stay
Admission: RE | Admit: 2022-03-04 | Discharge: 2022-03-04 | Disposition: A | Discharge status: Still In Hospital | Payer: Medicare Other | Source: Ambulatory Visit | Attending: Radiation Oncology | Admitting: Radiation Oncology

## 2022-03-04 ENCOUNTER — Other Ambulatory Visit: Payer: Self-pay

## 2022-03-04 ENCOUNTER — Encounter (HOSPITAL_COMMUNITY): Payer: Self-pay | Admitting: NURSE PRACTITIONER

## 2022-03-04 NOTE — Progress Notes (Signed)
Name: Shirley Hudson MRN:  [id number]   Date: 03/04/2022 Age: 46 y.o.        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    CHIEF COMPLAINT: Routine follow-up.    INTERVAL ONCOLOGIC HISTORY: I saw her on Mar 05, 2021.  She still complains of constipation.  She has not tolerated Amitiza or Linzess.  No pelvic pain or GI bleeding.    OTHER MEDICAL PROBLEMS: Rapid heart rate, reflux, depression, asthma    PAST MEDICAL HISTORY    ALLERGIES:   No Known Allergies     MEDICATIONS:   Current Outpatient Medications   Medication Instructions   . albuterol sulfate (PROVENTIL OR VENTOLIN OR PROAIR) 90 mcg/actuation Inhalation oral inhaler ProAir HFA 90 mcg/actuation aerosol inhaler   . ALPRAZolam (XANAX) 0.5 mg Oral Tablet No dose, route, or frequency recorded.   . AUVELITY 45-105 mg Oral tablet, IR & ER, biphasic No dose, route, or frequency recorded.   . bethanechol chloride (URECHOLINE) 5 mg Oral Tablet No dose, route, or frequency recorded.   . busPIRone (BUSPAR) 10 mg Oral Tablet No dose, route, or frequency recorded.   . cetirizine (ZYRTEC) 10 mg Oral Tablet No dose, route, or frequency recorded.   . cyanocobalamin (VITAMIN B12) 1,000 mcg/mL Injection Solution No dose, route, or frequency recorded.   Marland Kitchen Epinastine 0.05 % Ophthalmic Drops No dose, route, or frequency recorded.   . ergocalciferol, vitamin D2, (DRISDOL) 1,250 mcg (50,000 unit) Oral Capsule No dose, route, or frequency recorded.   Marland Kitchen esomeprazole magnesium (NEXIUM) 40 mg Oral Capsule, Delayed Release(E.C.) No dose, route, or frequency recorded.   . famotidine (PEPCID) 40 mg Oral Tablet No dose, route, or frequency recorded.   . fluticasone propionate (FLONASE) 50 mcg/actuation Nasal Spray, Suspension No dose, route, or frequency recorded.   . gabapentin (NEURONTIN) 300 mg Oral Capsule No dose,  route, or frequency recorded.   Marland Kitchen HYDROcodone-acetaminophen (NORCO) 5-325 mg Oral Tablet No dose, route, or frequency recorded.   Marland Kitchen LINZESS 145 mcg Oral Capsule No dose, route, or frequency recorded.   . meloxicam (MOBIC) 7.5 mg Oral Tablet No dose, route, or frequency recorded.   . methocarbamoL (ROBAXIN) 500 mg Oral Tablet No dose, route, or frequency recorded.   . metoprolol succinate (TOPROL-XL) 25 mg Oral Tablet Sustained Release 24 hr No dose, route, or frequency recorded.   . montelukast (SINGULAIR) 10 mg Oral Tablet No dose, route, or frequency recorded.   Marland Kitchen olopatadine (PATANOL) 0.1 % ophthalmic solution olopatadine 0.1 % eye drops   . pantoprazole (PROTONIX) 40 mg Oral Tablet, Delayed Release (E.C.) pantoprazole 40 mg tablet,delayed release   . pravastatin (PRAVACHOL) 20 mg Oral Tablet No dose, route, or frequency recorded.   . traMADoL (ULTRAM) 50 mg Oral Tablet No dose, route, or frequency recorded.        INTERVAL SURGERIES:   Past Surgical History:   Procedure Laterality Date   . CESAREAN SECTION     . DILATION AND CURETTAGE, DIAGNOSTIC / THERAPEUTIC     . HX BACK SURGERY     . HX COLONOSCOPY     . HX HEMICOLECTOMY          FAMILY HISTORY: Reviewed and unchanged    SOCIAL HISTORY: Reviewed and unchanged    REVIEW OF SYSTEMS  General: No anorexia,  weight loss, chills, night sweats, or documented fevers.  Endocrine: No diabetes, thyroid problems, corticosteroid use.  Respiratory: No coughing, wheezing, shortness of breath, asthma, emphysema, tuberculosis, black lung, asbestos exposure.  Cardiovascular: No syncope, palpitations, angina, hypertension, hyperlipidemia, myocardial infarction, deep vein thrombosis, history of rheumatic fever.  GI:  Constipation as above.  No dysphagia, abdominal pain, nausea, vomiting, rectal bleeding.  Musculoskeletal: No back pain, joint pain, broken bones.  Psychiatric:  Depression.        PHYSICAL EXAMINATION  General:  She appears well.  Performance Status:  ECOG  on  Vital Signs: There were no vitals taken for this visit.   Skin: No jaundice, urticaria, pigmented lesions, or radiation changes.  Nodes: No preauricular, submental, cervical, supraclavicular, axillary, inguinal lymph nodes felt.  Chest: Good respiratory excursions.  Clear to percussion and auscultation.  Cardiovascular: Neck veins flat.  Carotid pulses strong without bruits.  Rhythm regular, without murmurs, gallops, or rubs.  Abdomen: Nontender, without masses or organomegaly.  Musculoskeletal: No tenderness of the spine, ribs, or pelvis to fist percussion.  Extremities: No clubbing, cyanosis, edema, or arthritic deformities.  Psychiatric: Alert, oriented, and cooperative, with appropriate judgment and affect.      COMMENT: She is clinically disease-free 6 years and 2 months from diagnosis in March 2017.    She was advised to use Senokot regularly and to drink lots of water for constipation.    She would like to continue following up with [place] and we will see her back in the.      Charlotte Crumb, MD       cc:@

## 2022-03-04 NOTE — Addendum Note (Signed)
Encounter addended by: Milas Kocher, RN on: 03/04/2022 1:55 PM   Actions taken: Flowsheet accepted

## 2022-05-20 ENCOUNTER — Ambulatory Visit (HOSPITAL_COMMUNITY): Payer: Self-pay | Admitting: NURSE PRACTITIONER

## 2022-05-27 ENCOUNTER — Other Ambulatory Visit: Payer: Self-pay

## 2022-05-27 ENCOUNTER — Encounter (HOSPITAL_COMMUNITY): Payer: Self-pay | Admitting: HEMATOLOGY-ONCOLOGY

## 2022-05-27 ENCOUNTER — Other Ambulatory Visit (HOSPITAL_COMMUNITY): Payer: Medicare Other

## 2022-05-27 ENCOUNTER — Ambulatory Visit: Payer: Medicare Other | Attending: HEMATOLOGY-ONCOLOGY | Admitting: HEMATOLOGY-ONCOLOGY

## 2022-05-27 ENCOUNTER — Encounter (HOSPITAL_COMMUNITY): Payer: Self-pay | Admitting: NURSE PRACTITIONER

## 2022-05-27 VITALS — BP 124/82 | HR 72 | Temp 97.4°F | Ht 66.0 in | Wt 165.9 lb

## 2022-05-27 DIAGNOSIS — Z85038 Personal history of other malignant neoplasm of large intestine: Secondary | ICD-10-CM | POA: Insufficient documentation

## 2022-05-27 DIAGNOSIS — C20 Malignant neoplasm of rectum: Secondary | ICD-10-CM

## 2022-05-27 DIAGNOSIS — Z9049 Acquired absence of other specified parts of digestive tract: Secondary | ICD-10-CM | POA: Insufficient documentation

## 2022-05-27 DIAGNOSIS — Z08 Encounter for follow-up examination after completed treatment for malignant neoplasm: Secondary | ICD-10-CM | POA: Insufficient documentation

## 2022-05-27 DIAGNOSIS — Z9221 Personal history of antineoplastic chemotherapy: Secondary | ICD-10-CM | POA: Insufficient documentation

## 2022-05-27 DIAGNOSIS — Z801 Family history of malignant neoplasm of trachea, bronchus and lung: Secondary | ICD-10-CM | POA: Insufficient documentation

## 2022-05-27 DIAGNOSIS — Z808 Family history of malignant neoplasm of other organs or systems: Secondary | ICD-10-CM | POA: Insufficient documentation

## 2022-05-27 LAB — CBC WITH DIFF
BASOPHIL #: 0.1 10*3/uL (ref 0.00–0.30)
BASOPHIL %: 1 % (ref 0–3)
EOSINOPHIL #: 0.2 10*3/uL (ref 0.00–0.80)
EOSINOPHIL %: 3 % (ref 0–7)
HCT: 39.6 % (ref 37.0–47.0)
HGB: 13.1 g/dL (ref 12.5–16.0)
LYMPHOCYTE #: 1.5 10*3/uL (ref 1.10–5.00)
LYMPHOCYTE %: 32 % (ref 25–45)
MCH: 28.5 pg (ref 27.0–32.0)
MCHC: 33 g/dL (ref 32.0–36.0)
MCV: 86.4 fL (ref 78.0–99.0)
MONOCYTE #: 0.5 10*3/uL (ref 0.00–1.30)
MONOCYTE %: 11 % (ref 0–12)
MPV: 8.3 fL (ref 7.4–10.4)
NEUTROPHIL #: 2.4 10*3/uL (ref 1.80–8.40)
NEUTROPHIL %: 52 % (ref 40–76)
PLATELETS: 306 10*3/uL (ref 140–440)
RBC: 4.59 10*6/uL (ref 4.20–5.40)
RDW: 13.2 % (ref 11.6–14.8)
WBC: 4.6 10*3/uL (ref 4.0–10.5)
WBCS UNCORRECTED: 4.6 10*3/uL

## 2022-05-27 LAB — COMPREHENSIVE METABOLIC PANEL, NON-FASTING
ALBUMIN/GLOBULIN RATIO: 1.5 — ABNORMAL HIGH (ref 0.8–1.4)
ALBUMIN: 4.4 g/dL (ref 3.5–5.7)
ALKALINE PHOSPHATASE: 81 U/L (ref 34–104)
ALT (SGPT): 18 U/L (ref 7–52)
ANION GAP: 7 mmol/L — ABNORMAL LOW (ref 10–20)
AST (SGOT): 17 U/L (ref 13–39)
BILIRUBIN TOTAL: 0.4 mg/dL (ref 0.3–1.2)
BUN/CREA RATIO: 18 (ref 6–22)
BUN: 13 mg/dL (ref 7–25)
CALCIUM, CORRECTED: 9.4 mg/dL (ref 8.9–10.8)
CALCIUM: 9.8 mg/dL (ref 8.6–10.3)
CHLORIDE: 106 mmol/L (ref 98–107)
CO2 TOTAL: 28 mmol/L (ref 21–31)
CREATININE: 0.71 mg/dL (ref 0.60–1.30)
ESTIMATED GFR: 105 mL/min/{1.73_m2} (ref >59)
GLOBULIN: 3 (ref 2.9–5.4)
GLUCOSE: 114 mg/dL — ABNORMAL HIGH (ref 74–109)
OSMOLALITY, CALCULATED: 282 mOsm/kg (ref 270–290)
POTASSIUM: 4.7 mmol/L (ref 3.5–5.1)
PROTEIN TOTAL: 7.4 g/dL (ref 6.4–8.9)
SODIUM: 141 mmol/L (ref 136–145)

## 2022-05-27 LAB — CARCINOEMBRYONIC ANTIGEN: CEA: 0.6 ng/mL (ref <=5.0)

## 2022-05-27 NOTE — Progress Notes (Signed)
Department of Hematology/Oncology  Progress Note   Name: Shirley Hudson  KGU:[id number]  Date of Birth: [birth year]  Encounter Date: 05/27/2022    REFERRING PROVIDER:  Zadie Rhine, [place]  [place],  [place] [address]     TELEMEDICINE DOCUMENTATION:  Patient Location:  [facility] outpatient Hematology/Oncology [address], [place] [address]  Patient/family aware of provider location:  yes  Patient/family consent for telemedicine:  yes  Examination observed and performed by:  Patrcia Dolly APRN, FNP-BC, AOCNP      REASON FOR OFFICE VISIT:  Rectal Cancer     HISTORY OF PRESENT ILLNESS:  Shirley Hudson is a 46 y.o. female who presents today for follow up of rectal cancer. She was originally diagnosed in 2017, and states that hematochezia had been present for approximately the prior 2 years. In May 2017 she underwent an LAR surgery and was found to have a 2.2 cm primary lesion. 17 lymph nodes were removed and 1 was found to be involved. She was therefore stage III (T1N1aM0) and received adjuvant FOLFOX chemotherapy.     She has also had a couple of breast lesions that have been monitored. She states that there has been some discussion about whether to biopsy at least one of them. She says that she has follow up in the near future for that issue.     05/27/2022: The patient is here for follow up of rectal cancer.  She has been doing well clinically and denies any new problems.  She states that she is due for a colonoscopy in the near future and will be following up with GI.    ROS:   Pertinent review of systems as discussed in HPI    HISTORY:  Past Medical History:   Diagnosis Date    Afib (CMS HCC)     Malignant neoplasm of colon (CMS HCC)          Past Surgical History:   Procedure Laterality Date    CESAREAN SECTION      DILATION AND CURETTAGE, DIAGNOSTIC / THERAPEUTIC      HX BACK SURGERY      HX COLONOSCOPY      HX HEMICOLECTOMY           Social History     Socioeconomic  History    Marital status: Married     Spouse name: Not on file    Number of children: Not on file    Years of education: Not on file    Highest education level: Not on file   Occupational History    Not on file   Tobacco Use    Smoking status: Never    Smokeless tobacco: Never   Vaping Use    Vaping Use: Never used   Substance and Sexual Activity    Alcohol use: Never    Drug use: Never    Sexual activity: Not on file   Other Topics Concern    Not on file   Social History Narrative    Not on file     Social Determinants of Health     Financial Resource Strain: Not on file   Transportation Needs: Not on file   Social Connections: Not on file   Intimate Partner Violence: Not on file   Housing Stability: Not on file     Family Medical History:       Problem Relation (Age of Onset)    Lung Cancer Father, Maternal Grandfather  Throat cancer Paternal Grandfather            Current Outpatient Medications   Medication Sig    albuterol sulfate (PROVENTIL OR VENTOLIN OR PROAIR) 90 mcg/actuation Inhalation oral inhaler ProAir HFA 90 mcg/actuation aerosol inhaler    ALPRAZolam (XANAX) 0.5 mg Oral Tablet     AUVELITY 45-105 mg Oral tablet, IR & ER, biphasic     bethanechol chloride (URECHOLINE) 5 mg Oral Tablet     busPIRone (BUSPAR) 10 mg Oral Tablet     cetirizine (ZYRTEC) 10 mg Oral Tablet     cyanocobalamin (VITAMIN B12) 1,000 mcg/mL Injection Solution     Epinastine 0.05 % Ophthalmic Drops     ergocalciferol, vitamin D2, (DRISDOL) 1,250 mcg (50,000 unit) Oral Capsule     esomeprazole magnesium (NEXIUM) 40 mg Oral Capsule, Delayed Release(E.C.)     famotidine (PEPCID) 40 mg Oral Tablet     fluticasone propionate (FLONASE) 50 mcg/actuation Nasal Spray, Suspension     gabapentin (NEURONTIN) 100 mg Oral Capsule     HYDROcodone-acetaminophen (NORCO) 5-325 mg Oral Tablet     LINZESS 145 mcg Oral Capsule     meloxicam (MOBIC) 7.5 mg Oral Tablet     methocarbamoL (ROBAXIN) 500 mg Oral Tablet     metoprolol succinate (TOPROL-XL)  25 mg Oral Tablet Sustained Release 24 hr     montelukast (SINGULAIR) 10 mg Oral Tablet     olopatadine (PATANOL) 0.1 % ophthalmic solution olopatadine 0.1 % eye drops    pantoprazole (PROTONIX) 40 mg Oral Tablet, Delayed Release (E.C.) pantoprazole 40 mg tablet,delayed release    pravastatin (PRAVACHOL) 20 mg Oral Tablet     traMADoL (ULTRAM) 50 mg Oral Tablet      No Known Allergies    PHYSICAL EXAM:  Most Recent Vitals    Flowsheet Row Telemedicine from 02/18/2022 in Hematology/Oncology,   [facility]   Temperature 36.5 C (97.7 F) filed at... 02/18/2022 0934   Heart Rate 67 filed at... 02/18/2022 0934   Respiratory Rate --   BP (Non-Invasive) 117/92 filed at... 02/18/2022 0934   SpO2 --   Height 1.676 m ('5\' 6"'$ ) filed at... 02/18/2022 0934   Weight 76.2 kg (168 lb 1.6 oz) filed at... 02/18/2022 0934   BMI (Calculated) 27.19 filed at... 02/18/2022 0934   BSA (Calculated) 1.88 filed at... 02/18/2022 0934      ECOG Status: (0) Fully active, able to carry on all predisease performance without restriction   Physical Exam  Vitals reviewed.   Constitutional:       Appearance: Normal appearance. She is normal weight.   Neck:      Thyroid: No thyroid mass, thyromegaly or thyroid tenderness.   Cardiovascular:      Rate and Rhythm: Normal rate and regular rhythm.      Pulses: Normal pulses.      Heart sounds: Normal heart sounds, S1 normal and S2 normal. No murmur heard.    No S3 or S4 sounds.   Pulmonary:      Effort: Pulmonary effort is normal.      Breath sounds: Normal breath sounds.   Abdominal:      Palpations: Abdomen is soft.   Musculoskeletal:         General: Normal range of motion.      Cervical back: Normal range of motion.      Right lower leg: No edema.      Left lower leg: No edema.   Lymphadenopathy:  Head:      Right side of head: No submental, submandibular, tonsillar, preauricular, posterior auricular or occipital adenopathy.      Left side of head: No submental, submandibular,  tonsillar, preauricular, posterior auricular or occipital adenopathy.      Cervical: No cervical adenopathy.      Right cervical: No superficial, deep or posterior cervical adenopathy.     Left cervical: No superficial, deep or posterior cervical adenopathy.      Upper Body:      Right upper body: No supraclavicular or axillary adenopathy.      Left upper body: No supraclavicular or axillary adenopathy.   Skin:     General: Skin is warm.   Neurological:      General: No focal deficit present.      Mental Status: She is alert and oriented to person, place, and time.      Motor: Motor function is intact.      Coordination: Coordination is intact.   Psychiatric:         Attention and Perception: Attention and perception normal.         Mood and Affect: Mood normal.         Speech: Speech normal.         Behavior: Behavior normal. Behavior is cooperative.         Thought Content: Thought content normal.         Cognition and Memory: Cognition normal.         Judgment: Judgment normal.       DIAGNOSTIC DATA:  No results found for this or any previous visit (from the past 17520 hour(s)).    LABS:   CBC  Diff   Lab Results   Component Value Date/Time    WBC 4.6 05/27/2022 09:24 AM    HGB 13.1 05/27/2022 09:24 AM    HCT 39.6 05/27/2022 09:24 AM    PLTCNT 306 05/27/2022 09:24 AM    RBC 4.59 05/27/2022 09:24 AM    MCV 86.4 05/27/2022 09:24 AM    MCHC 33.0 05/27/2022 09:24 AM    MCH 28.5 05/27/2022 09:24 AM    RDW 13.2 05/27/2022 09:24 AM    MPV 8.3 05/27/2022 09:24 AM    Lab Results   Component Value Date/Time    PMNS 52 05/27/2022 09:24 AM    LYMPHOCYTES 32 05/27/2022 09:24 AM    EOSINOPHIL 3 05/27/2022 09:24 AM    MONOCYTES 11 05/27/2022 09:24 AM    BASOPHILS 1 05/27/2022 09:24 AM    BASOPHILS 0.10 05/27/2022 09:24 AM    PMNABS 2.40 05/27/2022 09:24 AM    LYMPHSABS 1.50 05/27/2022 09:24 AM    EOSABS 0.20 05/27/2022 09:24 AM    MONOSABS 0.50 05/27/2022 09:24 AM            Comprehensive Metabolic Profile    Lab Results    Component Value Date    SODIUM 141 05/27/2022    POTASSIUM 4.7 05/27/2022    CHLORIDE 106 05/27/2022    CO2 28 05/27/2022    ANIONGAP 7 (L) 05/27/2022    BUN 13 05/27/2022    CREATININE 0.71 05/27/2022    ALBUMIN 4.4 05/27/2022    CALCIUM 9.8 05/27/2022    GLUCOSENF 114 (H) 05/27/2022    ALKPHOS 81 05/27/2022    ALT 18 05/27/2022    AST 17 05/27/2022    TOTBILIRUBIN 0.4 05/27/2022    TOTALPROTEIN 7.4 05/27/2022          BASIC  METABOLIC PANEL  Lab Results   Component Value Date    SODIUM 141 05/27/2022    POTASSIUM 4.7 05/27/2022    CHLORIDE 106 05/27/2022    CO2 28 05/27/2022    ANIONGAP 7 (L) 05/27/2022    BUN 13 05/27/2022    CREATININE 0.71 05/27/2022    BUNCRRATIO 18 05/27/2022    GFR 105 05/27/2022    CALCIUM 9.8 05/27/2022    GLUCOSENF 114 (H) 05/27/2022           ASSESSMENT:  Problem List Items Addressed This Visit    None  Visit Diagnoses       Rectal cancer (CMS [place])    -  Primary             ICD-10-CM    1. Rectal cancer (CMS HCC)  C20            PLAN:   1. All relevant medical records were reviewed including available pertinent provider notes, procedure notes, imaging, laboratory, and pathology.   2. All pertinent labs and/or imaging were reviewed with the patient.   3. Rectal cancer: She is more than 5 years out from that diagnosis, but retains a high level of concern. We will check labs today including a CEA level, and continue with 3 month follow ups at her request. She will keep her follow up with GI for colonoscopy.     ICESIS RENN was given the chance to ask questions, and these were answered to their satisfaction. The patient is welcome to call with any questions or concerns in the meantime.    On the day of the encounter, a total of 35 minutes was spent on this patient encounter including review of historical information, examination, documentation and post-visit activities.   Return in about 3 months (around 08/27/2022).     Narda Rutherford, MD  05/27/2022, 15:39    The patient was seen as part of  a collaborative telemedicine service with Dr. Jake Shark who participated in the encounter by active presence via approved video/audio means for portions of the encounter.  Dr. Jake Shark and Patrcia Dolly APRN, FNP-BC, AOCNP  discussed the physical assessment, reviewed and interpreted all studies and the plan was mutually developed.       On the day of the encounter,  Patrcia Dolly APRN, FNP-BC, AOCNP spent a total of 10 minutes independently on this patient encounter including review of historical information, face to face patient care, documentation and post-visit activities.     Patrcia Dolly APRN, FNP-BC, AOCNP, 05/27/2022, 10:50  You can see your note(s) in [email or web address]. It is common for you to encounter certain medical terminology which may be unfamiliar to you. You might see results before your provider does so please give at least 2 business days for review. Please have this understanding, that NOT all abnormal results are significant. Our office will contact you for any urgent or emergent action if necessary. If you have any questions or concerns, feel free to send a MyChart message or call the office. Please call with any new or concerning symptoms.     CC:  Zadie Rhine, [place] [address]    Zadie Rhine, [place]  [place],  [place] [address]    This note was partially generated using MModal Fluency Direct system, and there may be some incorrect words, spellings, and punctuation that were not noted in checking the note before saving.

## 2022-06-09 ENCOUNTER — Other Ambulatory Visit (HOSPITAL_COMMUNITY): Payer: Self-pay | Admitting: Surgery

## 2022-06-09 DIAGNOSIS — R928 Other abnormal and inconclusive findings on diagnostic imaging of breast: Secondary | ICD-10-CM

## 2022-06-16 ENCOUNTER — Other Ambulatory Visit: Payer: Self-pay

## 2022-06-16 ENCOUNTER — Inpatient Hospital Stay
Admission: RE | Admit: 2022-06-16 | Discharge: 2022-06-16 | Disposition: A | Discharge status: Home / Self Care | Payer: Medicare Other | Source: Ambulatory Visit | Attending: Surgery | Admitting: Surgery

## 2022-06-16 DIAGNOSIS — R928 Other abnormal and inconclusive findings on diagnostic imaging of breast: Secondary | ICD-10-CM | POA: Insufficient documentation

## 2022-06-16 MED ORDER — GADOBUTROL 10 MMOL/10 ML (1 MMOL/ML) INTRAVENOUS SOLUTION
10.0000 mL | INTRAVENOUS | Status: AC
Start: 2022-06-16 — End: 2022-06-16
  Administered 2022-06-16: 7 mL via INTRAVENOUS

## 2022-06-17 DIAGNOSIS — R928 Other abnormal and inconclusive findings on diagnostic imaging of breast: Secondary | ICD-10-CM

## 2022-09-20 ENCOUNTER — Ambulatory Visit (HOSPITAL_COMMUNITY): Payer: Self-pay | Admitting: NURSE PRACTITIONER

## 2022-10-21 ENCOUNTER — Ambulatory Visit (HOSPITAL_COMMUNITY): Payer: Self-pay | Admitting: HEMATOLOGY-ONCOLOGY

## 2022-11-15 ENCOUNTER — Ambulatory Visit (HOSPITAL_COMMUNITY): Payer: Self-pay | Admitting: HEMATOLOGY-ONCOLOGY

## 2022-11-15 IMAGING — MR MRI LUMBAR SPINE WITHOUT AND WITH CONTRAST
8 series · 48 of 48 positions shown · IV contrast (gadavist)
Comparison: Lumbar spine x-ray dated 10/20/2022.  MRI lumbar spine dated 07/02/2021.

﻿EXAM:  72229   MRI LUMBAR SPINE WITHOUT AND WITH CONTRAST
INDICATION: History of trauma on October 16, 2022.  Persistent low back pain radiating to right hip after the fall.  Prior history of back surgery and surgery for colon cancer.
TECHNIQUE: Multiplanar, multisequential MRI of the lumbosacral spine was performed, including postcontrast after injection of 5 mL Gadavist IV.

[Series 5: T2 · sagittal · 4.0mm · 0.94mm/px · 5 of 13 slices shown (1 of 3)]
[im 1/13]
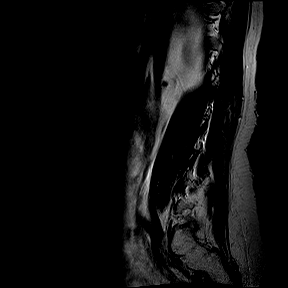
[im 4/13]
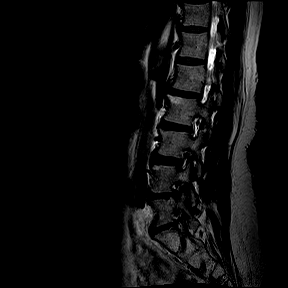
[im 7/13]
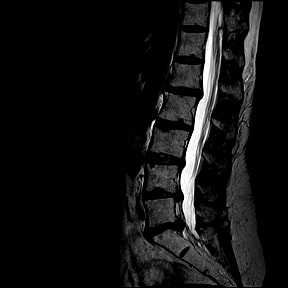
[im 10/13]
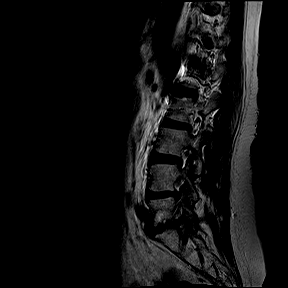
[im 13/13]
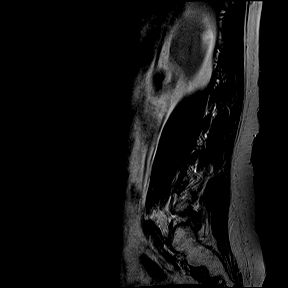

[Series 6: T1 · sagittal · 4.0mm · 0.94mm/px · 4 of 13 slices shown]
[im 1/13]
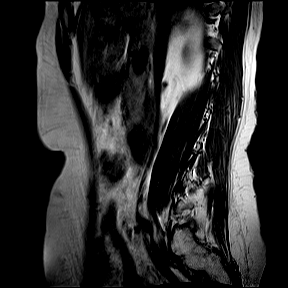
[im 5/13]
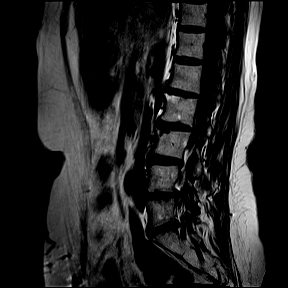
[im 9/13]
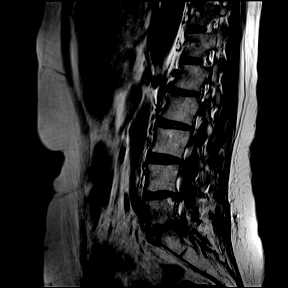
[im 13/13]
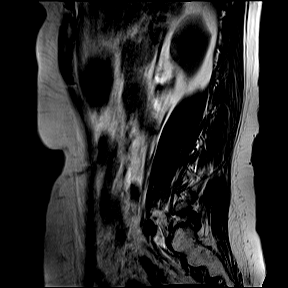

[Series 7: STIR · sagittal · 4.0mm · 1.05mm/px · 4 of 13 slices shown]
[im 1/13]
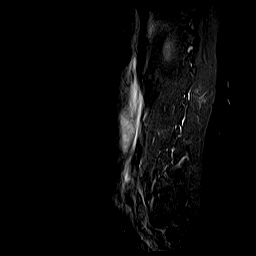
[im 5/13]
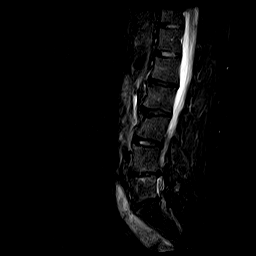
[im 9/13]
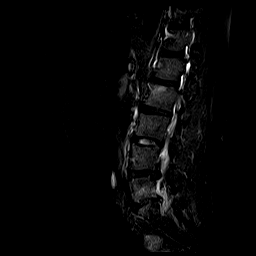
[im 13/13]
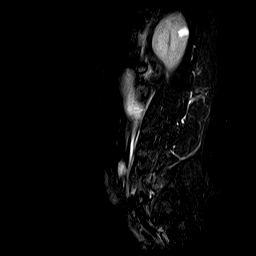

[Series 8: T2 · axial · 4.0mm · 0.47mm/px · z∈[-122,+54]mm · 8 of 23 slices shown (2 of 3)]
[im 1/23]
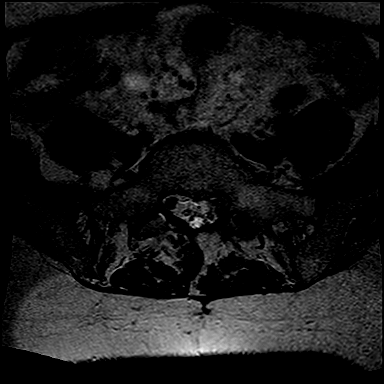
[im 4/23]
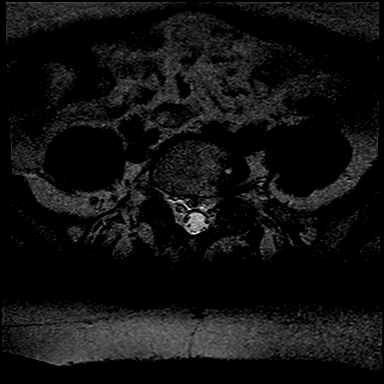
[im 7/23]
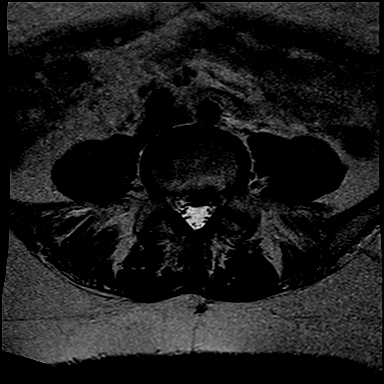
[im 10/23]
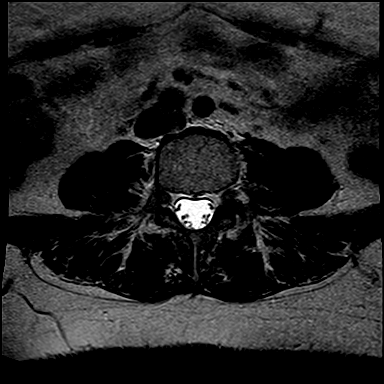
[im 13/23]
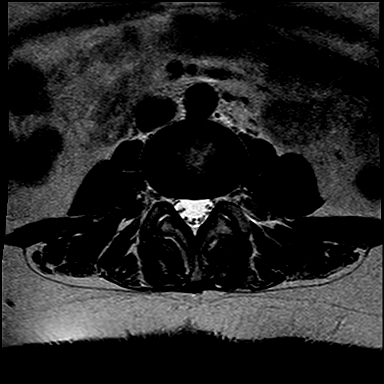
[im 16/23]
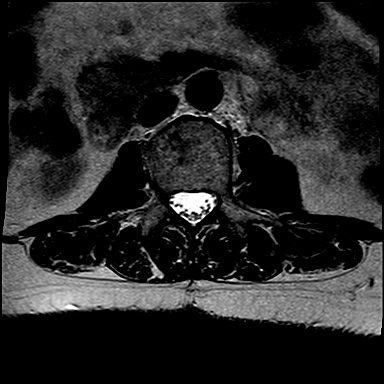
[im 19/23]
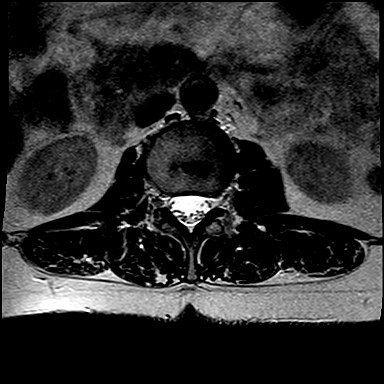
[im 23/23]
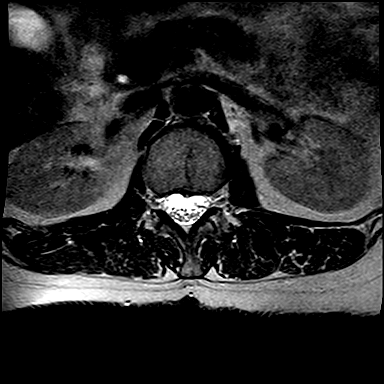

[Series 9: T1 fat-sat · axial · 4.0mm · 0.70mm/px · z∈[-122,+54]mm · 8 of 23 slices shown (1 of 2)]
[im 1/23]
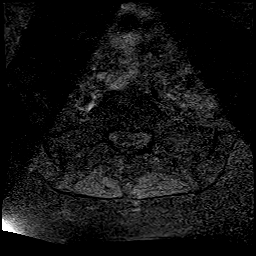
[im 4/23]
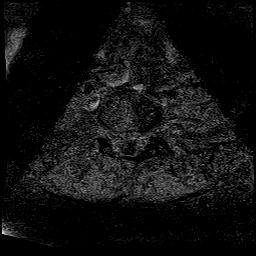
[im 7/23]
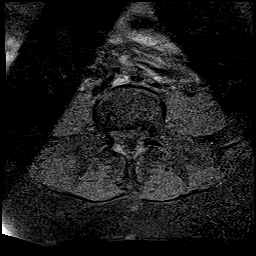
[im 10/23]
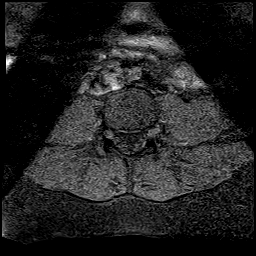
[im 13/23]
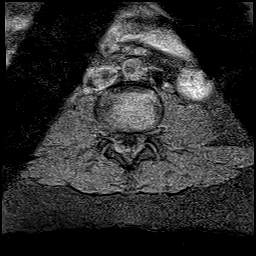
[im 16/23]
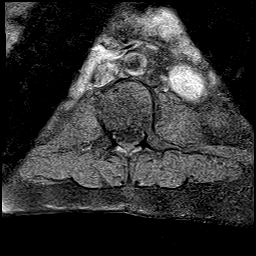
[im 19/23]
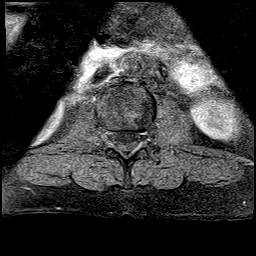
[im 23/23]
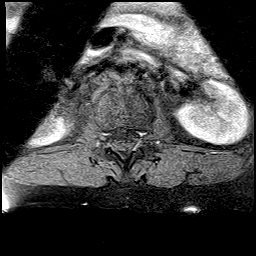

[Series 10: T2 · coronal · 4.0mm · 0.90mm/px · 7 of 20 slices shown (3 of 3)]
[im 1/20]
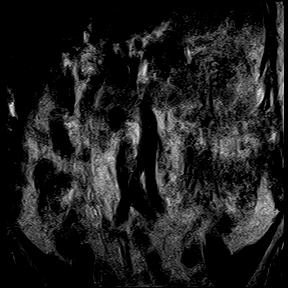
[im 4/20]
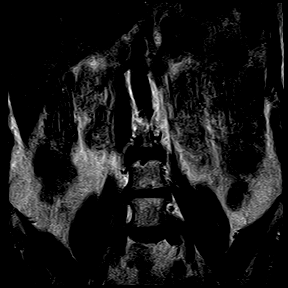
[im 7/20]
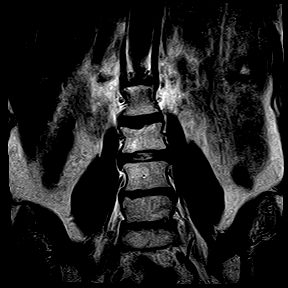
[im 10/20]
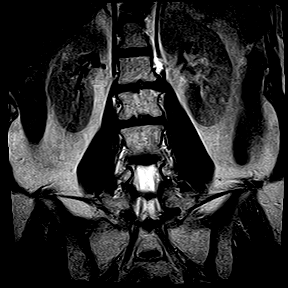
[im 13/20]
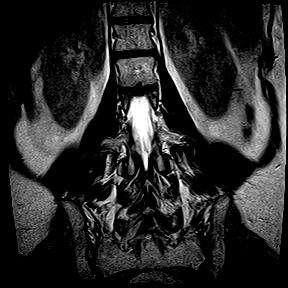
[im 16/20]
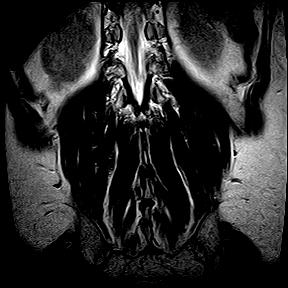
[im 20/20]
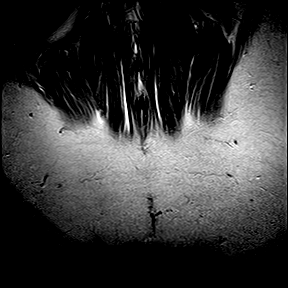

[Series 12: T1 fat-sat post-contrast · axial · 4.0mm · 0.70mm/px · z∈[-122,+54]mm · 8 of 23 slices shown]
[im 1/23]
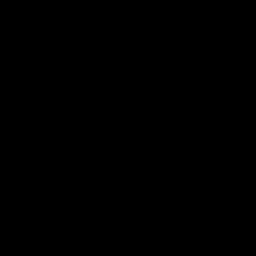
[im 4/23]
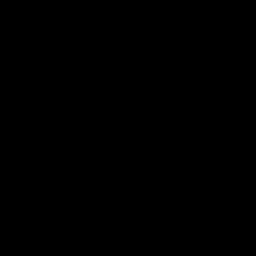
[im 7/23]
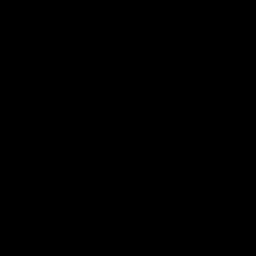
[im 10/23]
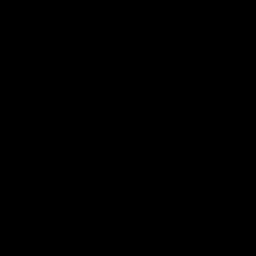
[im 13/23]
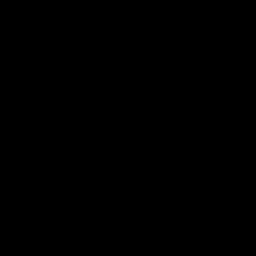
[im 16/23]
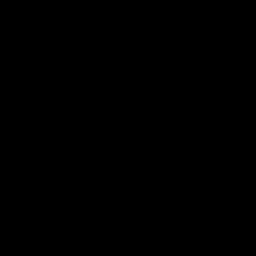
[im 19/23]
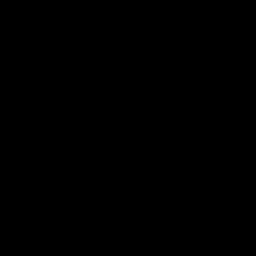
[im 23/23]
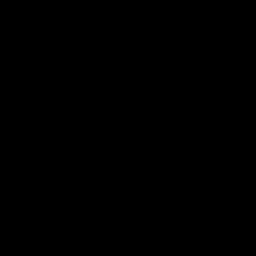

[Series 13: T1 fat-sat · sagittal · 4.0mm · 1.05mm/px · 4 of 13 slices shown (2 of 2)]
[im 1/13]
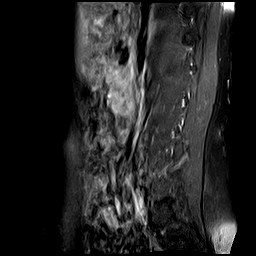
[im 5/13]
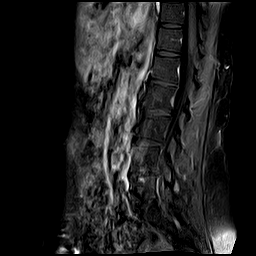
[im 9/13]
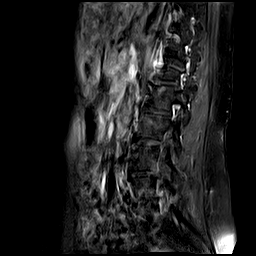
[im 13/13]
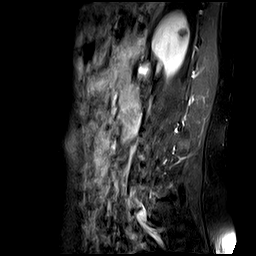

[48 of 48 positions shown; findings below may reference images not displayed]

FINDINGS: No acute or focal bone changes of lumbar vertebrae are seen.

Lower spinal cord and cauda equina do not show any focal findings.

At L1-2 level, degenerative disc disease is noted with small central to left paracentral disc protrusion mildly compromising thecal sac to the left of the midline at L1-2 level.  This finding is unchanged from prior study of 07/02/2021. 

At L2-3 level, no focal disc lesions are seen.  Degenerative disc disease and facet arthropathy cause minimal compromise of both lateral recesses. 

At L3-4 level, degenerative disc disease with facet arthropathy is causing mild compromise of both neural foramina similar to previous examination.

At L4-5 level, significant degenerative disc changes are seen with bulging annulus and facet arthropathy causing significant compromise of both lateral recesses and neural foramina at L4-5 level.  Bulging annulus is causing moderate compromise of thecal sac in the midline with AP diameter measuring 9 mm.  Findings at L4-5 level are slightly worse compared with prior study of 07/02/2021.

At L5-S1 level, postsurgical changes on the left side are noted.  There is no evidence of recurrent disc herniation at this level.  However, bulging annulus and facet arthropathy are causing moderate compromise of lateral recess on the left side and significant compromise of both neural foramina.

Paravertebral soft tissues are unremarkable.  No abnormal enhancement on postcontrast exam.
IMPRESSION: 1. No acute or focal bone changes of lumbar vertebrae.

2. At L4-5 level, significant degenerative disc changes are seen with bulging annulus and facet arthropathy causing significant compromise of both lateral recesses and neural foramina at L4-5 level.  Bulging annulus is causing moderate compromise of thecal sac in the midline with AP diameter measuring 9 mm.  Findings at L4-5 level are slightly worse compared with prior study of 07/02/2021.

3. At L5-S1 level, postsurgical changes on the left side are noted.  There is no evidence of recurrent disc herniation at this level.  However, bulging annulus and facet arthropathy are causing moderate compromise of lateral recess on the left side and significant compromise of both neural foramina.

4. No abnormal enhancement on postcontrast study.

## 2022-11-25 ENCOUNTER — Ambulatory Visit (HOSPITAL_COMMUNITY): Payer: Self-pay | Admitting: HEMATOLOGY-ONCOLOGY

## 2022-12-24 ENCOUNTER — Encounter (HOSPITAL_COMMUNITY): Payer: Self-pay | Admitting: HEMATOLOGY-ONCOLOGY

## 2022-12-24 ENCOUNTER — Ambulatory Visit: Payer: Medicare Other | Attending: HEMATOLOGY-ONCOLOGY | Admitting: HEMATOLOGY-ONCOLOGY

## 2022-12-24 ENCOUNTER — Other Ambulatory Visit: Payer: Self-pay

## 2022-12-24 VITALS — BP 117/83 | HR 82 | Temp 97.9°F | Ht 66.0 in | Wt 161.1 lb

## 2022-12-24 DIAGNOSIS — Z08 Encounter for follow-up examination after completed treatment for malignant neoplasm: Secondary | ICD-10-CM | POA: Insufficient documentation

## 2022-12-24 DIAGNOSIS — Z85038 Personal history of other malignant neoplasm of large intestine: Secondary | ICD-10-CM | POA: Insufficient documentation

## 2022-12-24 DIAGNOSIS — C189 Malignant neoplasm of colon, unspecified: Secondary | ICD-10-CM

## 2022-12-24 NOTE — Progress Notes (Unsigned)
Department of Hematology/Oncology  Progress Note   Name: Shirley Hudson  [id number]  Date of Birth: 09/01/1976  Encounter Date: 12/24/2022    REFERRING PROVIDER:  Zadie Rhine, [place]  [place],  [place] [address]     REASON FOR OFFICE VISIT:  Rectal Cancer     HISTORY OF PRESENT ILLNESS:  Shirley Hudson is a 47 y.o. female who presents today for follow up of rectal cancer. She was originally diagnosed in 2017, and states that hematochezia had been present for approximately the prior 2 years. In May 2017 she underwent an LAR surgery and was found to have a 2.2 cm primary lesion. 17 lymph nodes were removed and 1 was found to be involved. She was therefore stage III (T1N1aM0) and received adjuvant FOLFOX chemotherapy.     She has also had a couple of breast lesions that have been monitored. She states that there has been some discussion about whether to biopsy at least one of them. She says that she has follow up in the near future for that issue.     05/27/2022: The patient is here for follow up of rectal cancer.  She has been doing well clinically and denies any new problems.  She states that she is due for a colonoscopy in the near future and will be following up with GI.    12/24/2022: The patient is here for follow up of colorectal cancer.  She is about 5 years out from completion of therapy.  She is doing well and denies any new clinical symptoms at this time.    ROS:   Pertinent review of systems as discussed in HPI    HISTORY:  Past Medical History:   Diagnosis Date    Afib (CMS HCC)     Malignant neoplasm of colon (CMS HCC)          Past Surgical History:   Procedure Laterality Date    CESAREAN SECTION      DILATION AND CURETTAGE, DIAGNOSTIC / THERAPEUTIC      HX BACK SURGERY      HX COLONOSCOPY      HX HEMICOLECTOMY           Social History     Socioeconomic History    Marital status: Married     Spouse name: Not on file    Number of children: Not on file    Years of education: Not on file     Highest education level: Not on file   Occupational History    Not on file   Tobacco Use    Smoking status: Never    Smokeless tobacco: Never   Vaping Use    Vaping status: Never Used   Substance and Sexual Activity    Alcohol use: Never    Drug use: Never    Sexual activity: Not on file   Other Topics Concern    Not on file   Social History Narrative    Not on file     Social Determinants of Health     Financial Resource Strain: Not on file   Transportation Needs: Not on file   Social Connections: Not on file   Intimate Partner Violence: Not on file   Housing Stability: Not on file     Family Medical History:       Problem Relation (Age of Onset)    Lung Cancer Father, Maternal Grandfather    Throat cancer Paternal Grandfather  Current Outpatient Medications   Medication Sig    albuterol sulfate (PROVENTIL OR VENTOLIN OR PROAIR) 90 mcg/actuation Inhalation oral inhaler ProAir HFA 90 mcg/actuation aerosol inhaler    ALPRAZolam (XANAX) 0.5 mg Oral Tablet     bethanechol chloride (URECHOLINE) 5 mg Oral Tablet     busPIRone (BUSPAR) 10 mg Oral Tablet     cetirizine (ZYRTEC) 10 mg Oral Tablet     cyanocobalamin (VITAMIN B12) 1,000 mcg/mL Injection Solution     ergocalciferol, vitamin D2, (DRISDOL) 1,250 mcg (50,000 unit) Oral Capsule     esomeprazole magnesium (NEXIUM) 40 mg Oral Capsule, Delayed Release(E.C.)     famotidine (PEPCID) 40 mg Oral Tablet     fluticasone propionate (FLONASE) 50 mcg/actuation Nasal Spray, Suspension     gabapentin (NEURONTIN) 100 mg Oral Capsule     meloxicam (MOBIC) 7.5 mg Oral Tablet     metoprolol succinate (TOPROL-XL) 25 mg Oral Tablet Sustained Release 24 hr     montelukast (SINGULAIR) 10 mg Oral Tablet     olopatadine (PATANOL) 0.1 % ophthalmic solution olopatadine 0.1 % eye drops    pantoprazole (PROTONIX) 40 mg Oral Tablet, Delayed Release (E.C.) pantoprazole 40 mg tablet,delayed release    pravastatin (PRAVACHOL) 40 mg Oral Tablet     sennosides-docusate sodium (SENOKOT-S)  8.6-50 mg Oral Tablet Take 1 Tablet by mouth Every evening    traMADoL (ULTRAM) 50 mg Oral Tablet      No Known Allergies    PHYSICAL EXAM:  Most Recent Vitals    Flowsheet Row Telemedicine from 02/18/2022 in Hematology/Oncology,   [facility]   Temperature 36.5 C (97.7 F) filed at... 02/18/2022 0934   Heart Rate 67 filed at... 02/18/2022 0934   Respiratory Rate --   BP (Non-Invasive) 117/92 filed at... 02/18/2022 0934   SpO2 --   Height 1.676 m ('5\' 6"'$ ) filed at... 02/18/2022 0934   Weight 76.2 kg (168 lb 1.6 oz) filed at... 02/18/2022 0934   BMI (Calculated) 27.19 filed at... 02/18/2022 0934   BSA (Calculated) 1.88 filed at... 02/18/2022 0934      ECOG Status: (0) Fully active, able to carry on all predisease performance without restriction   Physical Exam  Vitals reviewed.   Constitutional:       Appearance: Normal appearance. She is normal weight.   Neck:      Thyroid: No thyroid mass, thyromegaly or thyroid tenderness.   Cardiovascular:      Rate and Rhythm: Normal rate and regular rhythm.      Pulses: Normal pulses.      Heart sounds: Normal heart sounds, S1 normal and S2 normal. No murmur heard.     No S3 or S4 sounds.   Pulmonary:      Effort: Pulmonary effort is normal.      Breath sounds: Normal breath sounds.   Abdominal:      Palpations: Abdomen is soft.   Musculoskeletal:         General: Normal range of motion.      Cervical back: Normal range of motion.      Right lower leg: No edema.      Left lower leg: No edema.   Lymphadenopathy:      Head:      Right side of head: No submental, submandibular, tonsillar, preauricular, posterior auricular or occipital adenopathy.      Left side of head: No submental, submandibular, tonsillar, preauricular, posterior auricular or occipital adenopathy.      Cervical: No cervical  adenopathy.      Right cervical: No superficial, deep or posterior cervical adenopathy.     Left cervical: No superficial, deep or posterior cervical adenopathy.      Upper  Body:      Right upper body: No supraclavicular or axillary adenopathy.      Left upper body: No supraclavicular or axillary adenopathy.   Skin:     General: Skin is warm.   Neurological:      General: No focal deficit present.      Mental Status: She is alert and oriented to person, place, and time.      Motor: Motor function is intact.      Coordination: Coordination is intact.   Psychiatric:         Attention and Perception: Attention and perception normal.         Mood and Affect: Mood normal.         Speech: Speech normal.         Behavior: Behavior normal. Behavior is cooperative.         Thought Content: Thought content normal.         Cognition and Memory: Cognition normal.         Judgment: Judgment normal.         DIAGNOSTIC DATA:  No results found for this or any previous visit (from the past 17520 hour(s)).    LABS:   CBC  Diff   Lab Results   Component Value Date/Time    WBC 4.6 05/27/2022 09:24 AM    HGB 13.1 05/27/2022 09:24 AM    HCT 39.6 05/27/2022 09:24 AM    PLTCNT 306 05/27/2022 09:24 AM    RBC 4.59 05/27/2022 09:24 AM    MCV 86.4 05/27/2022 09:24 AM    MCHC 33.0 05/27/2022 09:24 AM    MCH 28.5 05/27/2022 09:24 AM    RDW 13.2 05/27/2022 09:24 AM    MPV 8.3 05/27/2022 09:24 AM    Lab Results   Component Value Date/Time    PMNS 52 05/27/2022 09:24 AM    LYMPHOCYTES 32 05/27/2022 09:24 AM    EOSINOPHIL 3 05/27/2022 09:24 AM    MONOCYTES 11 05/27/2022 09:24 AM    BASOPHILS 1 05/27/2022 09:24 AM    BASOPHILS 0.10 05/27/2022 09:24 AM    PMNABS 2.40 05/27/2022 09:24 AM    LYMPHSABS 1.50 05/27/2022 09:24 AM    EOSABS 0.20 05/27/2022 09:24 AM    MONOSABS 0.50 05/27/2022 09:24 AM            Comprehensive Metabolic Profile    Lab Results   Component Value Date    SODIUM 141 05/27/2022    POTASSIUM 4.7 05/27/2022    CHLORIDE 106 05/27/2022    CO2 28 05/27/2022    ANIONGAP 7 (L) 05/27/2022    BUN 13 05/27/2022    CREATININE 0.71 05/27/2022    ALBUMIN 4.4 05/27/2022    CALCIUM 9.8 05/27/2022    GLUCOSENF 114  (H) 05/27/2022    ALKPHOS 81 05/27/2022    ALT 18 05/27/2022    AST 17 05/27/2022    TOTBILIRUBIN 0.4 05/27/2022    TOTALPROTEIN 7.4 05/27/2022          BASIC METABOLIC PANEL  Lab Results   Component Value Date    SODIUM 141 05/27/2022    POTASSIUM 4.7 05/27/2022    CHLORIDE 106 05/27/2022    CO2 28 05/27/2022    ANIONGAP 7 (L) 05/27/2022    BUN  13 05/27/2022    CREATININE 0.71 05/27/2022    BUNCRRATIO 18 05/27/2022    GFR 105 05/27/2022    CALCIUM 9.8 05/27/2022    GLUCOSENF 114 (H) 05/27/2022           ASSESSMENT:  Problem List Items Addressed This Visit    None  Visit Diagnoses       Colon cancer (CMS [place])    -  Primary    Relevant Orders    COMPREHENSIVE METABOLIC PANEL, NON-FASTING    CBC    CARCINOEMBRYONIC ANTIGEN             ICD-10-CM    1. Colon cancer (CMS HCC)  C18.9 COMPREHENSIVE METABOLIC PANEL, NON-FASTING     CBC     CARCINOEMBRYONIC ANTIGEN           PLAN:   1. All relevant medical records were reviewed including available pertinent provider notes, procedure notes, imaging, laboratory, and pathology.   2. All pertinent labs and/or imaging were reviewed with the patient.   3. Rectal cancer: She is more than 5 years out from that diagnosis, but retains a high level of concern.  We reviewed her most recent labs that I have in the system and we will tentatively plan on seeing her back every 3 months for now for ongoing surveillance.    GERTURDE BLAKENSHIP was given the chance to ask questions, and these were answered to their satisfaction. The patient is welcome to call with any questions or concerns in the meantime.    On the day of the encounter, a total of 35 minutes was spent on this patient encounter including review of historical information, examination, documentation and post-visit activities.   Return in about 3 months (around 03/26/2023).     Narda Rutherford, MD  12/24/2022, 11:12 a.m.    CC:  Zadie Rhine, [place] [id number]    Zadie Rhine, [address]  [place],  [place] [address]    This note was partially generated using MModal Fluency Direct system, and there may be some incorrect words, spellings, and punctuation that were not noted in checking the note before saving.

## 2023-03-03 ENCOUNTER — Ambulatory Visit (HOSPITAL_BASED_OUTPATIENT_CLINIC_OR_DEPARTMENT_OTHER): Payer: Self-pay

## 2023-03-09 ENCOUNTER — Inpatient Hospital Stay
Admission: RE | Admit: 2023-03-09 | Discharge: 2023-03-09 | Disposition: A | Discharge status: Still In Hospital | Payer: Medicare Other | Source: Ambulatory Visit | Attending: RADIATION ONCOLOGY | Admitting: RADIATION ONCOLOGY

## 2023-03-09 ENCOUNTER — Other Ambulatory Visit: Payer: Self-pay

## 2023-03-09 ENCOUNTER — Telehealth (HOSPITAL_COMMUNITY): Payer: Self-pay | Admitting: HEMATOLOGY-ONCOLOGY

## 2023-03-09 ENCOUNTER — Other Ambulatory Visit (HOSPITAL_BASED_OUTPATIENT_CLINIC_OR_DEPARTMENT_OTHER): Payer: Medicare Other

## 2023-03-09 DIAGNOSIS — Z9049 Acquired absence of other specified parts of digestive tract: Secondary | ICD-10-CM | POA: Insufficient documentation

## 2023-03-09 DIAGNOSIS — C20 Malignant neoplasm of rectum: Secondary | ICD-10-CM | POA: Insufficient documentation

## 2023-03-09 DIAGNOSIS — Z9221 Personal history of antineoplastic chemotherapy: Secondary | ICD-10-CM | POA: Insufficient documentation

## 2023-03-09 DIAGNOSIS — Z08 Encounter for follow-up examination after completed treatment for malignant neoplasm: Secondary | ICD-10-CM | POA: Insufficient documentation

## 2023-03-09 DIAGNOSIS — Z85038 Personal history of other malignant neoplasm of large intestine: Secondary | ICD-10-CM | POA: Insufficient documentation

## 2023-03-09 DIAGNOSIS — Z923 Personal history of irradiation: Secondary | ICD-10-CM | POA: Insufficient documentation

## 2023-03-09 NOTE — Progress Notes (Signed)
Name: Shirley Hudson MRN:  [id number]   Date: 03/09/2023 Age: 47 y.o.       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    TREATMENT INTERVAL:  Patient completed chemoradiation 2017.     INTERVAL ONCOLOGIC HISTORY:  Shirley Hudson she returns in follow up.  She has had no new complaints but has had some difficulty with bowel control.  She vacillates between diarrhea and constipation depending on the medication she has taken.  Primary care physician has given her some medication recently.  She has had a colonoscopy with Dr. Allena Katz in the last 2 years.  She denies blood in stool or other new complaints.    OTHER MEDICAL PROBLEMS:     There are no exam notes on file for this visit.     PAST MEDICAL HISTORY    Past Medical History:   Diagnosis Date    Afib (CMS HCC)     Malignant neoplasm of colon (CMS HCC)         ALLERGIES:   No Known Allergies     MEDICATIONS:   Current Outpatient Medications   Medication Instructions    albuterol sulfate (PROVENTIL OR VENTOLIN OR PROAIR) 90 mcg/actuation Inhalation oral inhaler ProAir HFA 90 mcg/actuation aerosol inhaler    ALPRAZolam (XANAX) 0.5 mg Oral Tablet No dose, route, or frequency recorded.    bethanechol chloride (URECHOLINE) 5 mg Oral Tablet No dose, route, or frequency recorded.    busPIRone (BUSPAR) 10 mg Oral Tablet No dose, route, or frequency recorded.    cetirizine (ZYRTEC) 10 mg Oral Tablet No dose, route, or frequency recorded.    cyanocobalamin (VITAMIN B12) 1,000 mcg/mL Injection Solution No dose, route, or frequency recorded.    ergocalciferol, vitamin D2, (DRISDOL) 1,250 mcg (50,000 unit) Oral Capsule No dose, route, or frequency recorded.    esomeprazole magnesium (NEXIUM) 40 mg Oral Capsule, Delayed Release(E.C.) No dose, route, or frequency recorded.    famotidine (PEPCID) 40 mg Oral Tablet No dose, route, or frequency recorded.    fluticasone propionate (FLONASE) 50  mcg/actuation Nasal Spray, Suspension No dose, route, or frequency recorded.    gabapentin (NEURONTIN) 100 mg Oral Capsule     meloxicam (MOBIC) 7.5 mg Oral Tablet No dose, route, or frequency recorded.    metoprolol succinate (TOPROL-XL) 25 mg Oral Tablet Sustained Release 24 hr No dose, route, or frequency recorded.    montelukast (SINGULAIR) 10 mg Oral Tablet No dose, route, or frequency recorded.    olopatadine (PATANOL) 0.1 % ophthalmic solution olopatadine 0.1 % eye drops    pantoprazole (PROTONIX) 40 mg Oral Tablet, Delayed Release (E.C.) pantoprazole 40 mg tablet,delayed release    pravastatin (PRAVACHOL) 40 mg Oral Tablet     sennosides-docusate sodium (SENOKOT-S) 8.6-50 mg Oral Tablet 1 Tablet, Oral, EVERY EVENING    traMADoL (ULTRAM) 50 mg Oral Tablet No dose, route, or frequency recorded.        INTERVAL SURGERIES:   Past Surgical History:   Procedure Laterality Date    CESAREAN SECTION      DILATION AND CURETTAGE, DIAGNOSTIC / THERAPEUTIC      HX BACK SURGERY      HX COLONOSCOPY      HX HEMICOLECTOMY          FAMILY HISTORY: Reviewed and unchanged    SOCIAL HISTORY: Reviewed and unchanged    REVIEW OF SYSTEMS  Pertinent review of systems as discussed in  interval oncological history      PHYSICAL EXAMINATION  Physical Exam  HENT:      Head: Normocephalic.   Eyes:      Extraocular Movements: Extraocular movements intact.      Pupils: Pupils are equal, round, and reactive to light.   Pulmonary:      Effort: Pulmonary effort is normal.   Neurological:      General: No focal deficit present.          RADIOGRAPHS:   None  LABORATORY RESULT:  None  ASSESSMENT & PLAN  Shirley Hudson is a pleasant 47 year old with rectal cancer.  She received concurrent chemoradiotherapy 7 years ago and has done well since aside from continued difficulty with bowel movements.  I have recommended signatera and monitoring of her disease status.  We will have her return in 3 months to repeat signal Shirley Hudson with instructions for her to call  in the interim should she have problems or concerns.  We will call her with results when available.    Molli Barrows, MD  03/09/2023 09:46     [email or web address]

## 2023-03-28 ENCOUNTER — Ambulatory Visit (HOSPITAL_COMMUNITY): Payer: Self-pay | Admitting: HEMATOLOGY-ONCOLOGY

## 2023-04-18 ENCOUNTER — Telehealth (INDEPENDENT_AMBULATORY_CARE_PROVIDER_SITE_OTHER): Payer: Self-pay | Admitting: HEMATOLOGY-ONCOLOGY

## 2023-04-22 ENCOUNTER — Ambulatory Visit (INDEPENDENT_AMBULATORY_CARE_PROVIDER_SITE_OTHER): Payer: Self-pay | Admitting: HEMATOLOGY-ONCOLOGY

## 2023-05-20 ENCOUNTER — Ambulatory Visit (INDEPENDENT_AMBULATORY_CARE_PROVIDER_SITE_OTHER): Payer: Self-pay

## 2023-05-20 ENCOUNTER — Ambulatory Visit (INDEPENDENT_AMBULATORY_CARE_PROVIDER_SITE_OTHER): Payer: Self-pay | Admitting: NURSE PRACTITIONER

## 2023-06-17 ENCOUNTER — Ambulatory Visit (INDEPENDENT_AMBULATORY_CARE_PROVIDER_SITE_OTHER): Payer: Self-pay | Admitting: NURSE PRACTITIONER

## 2023-06-24 ENCOUNTER — Encounter (INDEPENDENT_AMBULATORY_CARE_PROVIDER_SITE_OTHER): Payer: Self-pay | Admitting: NURSE PRACTITIONER

## 2023-06-24 ENCOUNTER — Other Ambulatory Visit: Payer: Self-pay

## 2023-06-24 ENCOUNTER — Inpatient Hospital Stay (INDEPENDENT_AMBULATORY_CARE_PROVIDER_SITE_OTHER): Admission: RE | Admit: 2023-06-24 | Payer: Medicare Other | Source: Ambulatory Visit

## 2023-06-24 ENCOUNTER — Ambulatory Visit (HOSPITAL_BASED_OUTPATIENT_CLINIC_OR_DEPARTMENT_OTHER): Payer: Medicare Other | Admitting: NURSE PRACTITIONER

## 2023-06-24 VITALS — BP 144/77 | HR 55 | Temp 97.7°F | Ht 66.0 in | Wt 165.6 lb

## 2023-06-24 DIAGNOSIS — Z029 Encounter for administrative examinations, unspecified: Secondary | ICD-10-CM

## 2023-06-28 ENCOUNTER — Encounter (INDEPENDENT_AMBULATORY_CARE_PROVIDER_SITE_OTHER): Payer: Self-pay | Admitting: NURSE PRACTITIONER

## 2023-06-28 NOTE — Progress Notes (Signed)
The patient was unable to wait to be seen on date of appointment/or scheduled appointment was cancelled.  This office visit opened in error.      Marvene Staff APRN, FNP-BC, AOCNP, 06/28/2023 , 08:03

## 2023-08-11 NOTE — Cancer Center Note (Incomplete)
Department of Hematology/Oncology  Progress Note   Name: Shirley Hudson  ZOX:[id number]  Date of Birth: April 30, 1976  Encounter Date: 08/12/2023    REFERRING PROVIDER:  Yaakov Guthrie, FNP  [address]  [place],  [place] [address]     REASON FOR OFFICE VISIT:  Rectal Cancer and Colon Cancer     HISTORY OF PRESENT ILLNESS:  Shirley Hudson is a 47 y.o. female who presents today for follow up of rectal cancer. She was originally diagnosed in 2017, and states that hematochezia had been present for approximately the prior 2 years. In May 2017 she underwent an LAR surgery and was found to have a 2.2 cm primary lesion. 17 lymph nodes were removed and 1 was found to be involved. She was therefore stage III (T1N1aM0) and received adjuvant FOLFOX chemotherapy.     She has also had a couple of breast lesions that have been monitored. She states that there has been some discussion about whether to biopsy at least one of them. She says that she has follow up in the near future for that issue.     05/27/2022: The patient is here for follow up of rectal cancer.  She has been doing well clinically and denies any new problems.  She states that she is due for a colonoscopy in the near future and will be following up with GI.    12/24/2022: The patient is here for follow up of colorectal cancer.  She is about 5 years out from completion of therapy.  She is doing well and denies any new clinical symptoms at this time.    08/12/23:    Patient is here for follow up of colorectal cancer.  She is 6 years out from the follow up of rectal cancer.  She reports that she is having some left upper quadrant pain.  She has not been able to have a good bowel movement this week as she can not take the bowel medication due to not being home.   She reports her ears feel like they have fluid in them.           ROS:   Pertinent review of systems as discussed in HPI    HISTORY:  Past Medical History:   Diagnosis Date    Afib (CMS HCC)     Malignant neoplasm  of colon (CMS HCC)          Past Surgical History:   Procedure Laterality Date    CESAREAN SECTION      DILATION AND CURETTAGE, DIAGNOSTIC / THERAPEUTIC      HX BACK SURGERY      HX COLONOSCOPY      HX HEMICOLECTOMY           Social History     Socioeconomic History    Marital status: Married     Spouse name: Not on file    Number of children: Not on file    Years of education: Not on file    Highest education level: Not on file   Occupational History    Not on file   Tobacco Use    Smoking status: Never    Smokeless tobacco: Never   Vaping Use    Vaping status: Never Used   Substance and Sexual Activity    Alcohol use: Never    Drug use: Never    Sexual activity: Not on file   Other Topics Concern    Not on file   Social History  Narrative    Not on file     Social Determinants of Health     Financial Resource Strain: Not on file   Transportation Needs: Not on file   Social Connections: Not on file   Intimate Partner Violence: Not on file   Housing Stability: Not on file     Family Medical History:       Problem Relation (Age of Onset)    Lung Cancer Father, Maternal Grandfather    Throat cancer Paternal Grandfather            Current Outpatient Medications   Medication Sig    albuterol sulfate (PROVENTIL OR VENTOLIN OR PROAIR) 90 mcg/actuation Inhalation oral inhaler ProAir HFA 90 mcg/actuation aerosol inhaler    ALPRAZolam (XANAX) 0.5 mg Oral Tablet     bethanechol chloride (URECHOLINE) 5 mg Oral Tablet     busPIRone (BUSPAR) 10 mg Oral Tablet     cetirizine (ZYRTEC) 10 mg Oral Tablet     cyanocobalamin (VITAMIN B12) 1,000 mcg/mL Injection Solution     ergocalciferol, vitamin D2, (DRISDOL) 1,250 mcg (50,000 unit) Oral Capsule     esomeprazole magnesium (NEXIUM) 40 mg Oral Capsule, Delayed Release(E.C.)     famotidine (PEPCID) 40 mg Oral Tablet     fluticasone propionate (FLONASE) 50 mcg/actuation Nasal Spray, Suspension     gabapentin (NEURONTIN) 100 mg Oral Capsule     metoprolol succinate (TOPROL-XL) 25 mg Oral  Tablet Sustained Release 24 hr     montelukast (SINGULAIR) 10 mg Oral Tablet     olopatadine (PATANOL) 0.1 % ophthalmic solution olopatadine 0.1 % eye drops    pravastatin (PRAVACHOL) 40 mg Oral Tablet     sennosides-docusate sodium (SENOKOT-S) 8.6-50 mg Oral Tablet Take 1 Tablet by mouth Every evening     No Known Allergies    PHYSICAL EXAM:  BP (!) 149/88 (Site: Right Arm, Patient Position: Sitting, Cuff Size: Adult)   Pulse 64   Temp 36.7 C (98.1 F) (Temporal)   Ht 1.676 m (5\' 6" )   Wt 74.3 kg (163 lb 12.8 oz)   BMI 26.44 kg/m        ECOG Status: (0) Fully active, able to carry on all predisease performance without restriction   Physical Exam  Vitals reviewed.   Constitutional:       Appearance: Normal appearance. She is normal weight.   HENT:      Right Ear: Tympanic membrane is bulging.      Left Ear: Tympanic membrane normal.   Neck:      Thyroid: No thyroid mass, thyromegaly or thyroid tenderness.   Cardiovascular:      Rate and Rhythm: Normal rate and regular rhythm.      Pulses: Normal pulses.      Heart sounds: Normal heart sounds, S1 normal and S2 normal. No murmur heard.     No S3 or S4 sounds.   Pulmonary:      Effort: Pulmonary effort is normal.      Breath sounds: Normal breath sounds.   Abdominal:      Palpations: Abdomen is soft.   Musculoskeletal:         General: Normal range of motion.      Cervical back: Normal range of motion.      Right lower leg: No edema.      Left lower leg: No edema.   Lymphadenopathy:      Head:      Right side of head: No submental, submandibular,  tonsillar, preauricular, posterior auricular or occipital adenopathy.      Left side of head: No submental, submandibular, tonsillar, preauricular, posterior auricular or occipital adenopathy.      Cervical: No cervical adenopathy.      Right cervical: No superficial, deep or posterior cervical adenopathy.     Left cervical: No superficial, deep or posterior cervical adenopathy.      Upper Body:      Right upper body:  No supraclavicular or axillary adenopathy.      Left upper body: No supraclavicular or axillary adenopathy.   Skin:     General: Skin is warm.   Neurological:      General: No focal deficit present.      Mental Status: She is alert and oriented to person, place, and time.      Motor: Motor function is intact.      Coordination: Coordination is intact.   Psychiatric:         Attention and Perception: Attention and perception normal.         Mood and Affect: Mood normal.         Speech: Speech normal.         Behavior: Behavior normal. Behavior is cooperative.         Thought Content: Thought content normal.         Cognition and Memory: Cognition normal.         Judgment: Judgment normal.         DIAGNOSTIC DATA:  No results found for this or any previous visit (from the past 72536 hour(s)).    LABS:   CBC  Diff   Lab Results   Component Value Date/Time    WBC 4.9 08/12/2023 08:54 AM    HGB 12.5 08/12/2023 08:54 AM    HCT 37.0 08/12/2023 08:54 AM    PLTCNT 302 08/12/2023 08:54 AM    RBC 4.29 08/12/2023 08:54 AM    MCV 86.1 08/12/2023 08:54 AM    MCHC 33.8 08/12/2023 08:54 AM    MCH 29.1 08/12/2023 08:54 AM    RDW 12.9 08/12/2023 08:54 AM    MPV 8.1 08/12/2023 08:54 AM    Lab Results   Component Value Date/Time    PMNS 52 05/27/2022 09:24 AM    LYMPHOCYTES 32 05/27/2022 09:24 AM    EOSINOPHIL 3 05/27/2022 09:24 AM    MONOCYTES 11 05/27/2022 09:24 AM    BASOPHILS 1 05/27/2022 09:24 AM    BASOPHILS 0.10 05/27/2022 09:24 AM    PMNABS 2.40 05/27/2022 09:24 AM    LYMPHSABS 1.50 05/27/2022 09:24 AM    EOSABS 0.20 05/27/2022 09:24 AM    MONOSABS 0.50 05/27/2022 09:24 AM            Comprehensive Metabolic Profile    Lab Results   Component Value Date    SODIUM 143 08/12/2023    POTASSIUM 3.9 08/12/2023    CHLORIDE 109 (H) 08/12/2023    CO2 26 08/12/2023    ANIONGAP 8 08/12/2023    BUN 13 08/12/2023    CREATININE 0.75 08/12/2023    ALBUMIN 4.5 08/12/2023    CALCIUM 9.3 08/12/2023    GLUCOSENF 111 (H) 08/12/2023    ALKPHOS 77  08/12/2023    ALT 16 08/12/2023    AST 18 08/12/2023    TOTBILIRUBIN 0.6 08/12/2023    TOTALPROTEIN 7.7 08/12/2023         ESTIMATED GFR   Date Value Ref Range Status   08/12/2023 98 >  59 mL/min/1.63m^2 Final                ASSESSMENT:  Problem List Items Addressed This Visit    None  Visit Diagnoses       Rectal cancer (CMS HCC)    -  Primary    Left upper quadrant abdominal pain        Relevant Orders    XR KUB               ICD-10-CM    1. Rectal cancer (CMS HCC)  C20       2. Left upper quadrant abdominal pain  R10.12 XR KUB             PLAN:   1. All relevant medical records were reviewed including available pertinent provider notes, procedure notes, imaging, laboratory, and pathology.   2. All pertinent labs and/or imaging were reviewed with the patient.   3. Rectal cancer: She is more than 5 years out from that diagnosis, but retains a high level of concern.  We reviewed her most recent labs that I have in the system and we will tentatively plan on seeing her back every 3 months for now for ongoing surveillance.  4. Left upper quadrant pain:  Possible constipation.  Will evaluate with a KUB.      Shirley Hudson was given the chance to ask questions, and these were answered to their satisfaction. The patient is welcome to call with any questions or concerns in the meantime.      Return in about 3 months (around 11/12/2023).   Marvene Staff APRN, FNP-BC, AOCNP, 08/12/2023 , 09:26       CC:  Yaakov Guthrie, FNP  [address]  [place] [address]    Yaakov Guthrie, FNP  [address]  [place],  [place] [address]    This note was partially generated using MModal Fluency Direct system, and there may be some incorrect words, spellings, and punctuation that were not noted in checking the note before saving.

## 2023-08-12 ENCOUNTER — Ambulatory Visit (INDEPENDENT_AMBULATORY_CARE_PROVIDER_SITE_OTHER)
Admission: RE | Admit: 2023-08-12 | Discharge: 2023-08-12 | Disposition: A | Discharge status: Home / Self Care | Payer: Medicare Other | Source: Ambulatory Visit | Attending: NURSE PRACTITIONER | Admitting: NURSE PRACTITIONER

## 2023-08-12 ENCOUNTER — Ambulatory Visit: Payer: Medicare Other | Attending: NURSE PRACTITIONER | Admitting: NURSE PRACTITIONER

## 2023-08-12 ENCOUNTER — Encounter (INDEPENDENT_AMBULATORY_CARE_PROVIDER_SITE_OTHER): Payer: Self-pay | Admitting: NURSE PRACTITIONER

## 2023-08-12 ENCOUNTER — Other Ambulatory Visit: Payer: Self-pay

## 2023-08-12 ENCOUNTER — Inpatient Hospital Stay (HOSPITAL_BASED_OUTPATIENT_CLINIC_OR_DEPARTMENT_OTHER)
Admission: RE | Admit: 2023-08-12 | Discharge: 2023-08-12 | Disposition: A | Discharge status: Home / Self Care | Payer: Medicare Other | Source: Ambulatory Visit | Attending: NURSE PRACTITIONER

## 2023-08-12 VITALS — BP 149/88 | HR 64 | Temp 98.1°F | Ht 66.0 in | Wt 163.8 lb

## 2023-08-12 DIAGNOSIS — Z85048 Personal history of other malignant neoplasm of rectum, rectosigmoid junction, and anus: Secondary | ICD-10-CM | POA: Insufficient documentation

## 2023-08-12 DIAGNOSIS — C189 Malignant neoplasm of colon, unspecified: Secondary | ICD-10-CM

## 2023-08-12 DIAGNOSIS — H938X1 Other specified disorders of right ear: Secondary | ICD-10-CM | POA: Insufficient documentation

## 2023-08-12 DIAGNOSIS — R1012 Left upper quadrant pain: Secondary | ICD-10-CM

## 2023-08-12 DIAGNOSIS — C775 Secondary and unspecified malignant neoplasm of intrapelvic lymph nodes: Secondary | ICD-10-CM

## 2023-08-12 DIAGNOSIS — Z08 Encounter for follow-up examination after completed treatment for malignant neoplasm: Secondary | ICD-10-CM | POA: Insufficient documentation

## 2023-08-12 DIAGNOSIS — Z9049 Acquired absence of other specified parts of digestive tract: Secondary | ICD-10-CM | POA: Insufficient documentation

## 2023-08-12 DIAGNOSIS — C20 Malignant neoplasm of rectum: Secondary | ICD-10-CM

## 2023-08-12 LAB — COMPREHENSIVE METABOLIC PANEL, NON-FASTING
ALBUMIN/GLOBULIN RATIO: 1.4 (ref 0.8–1.4)
ALBUMIN: 4.5 g/dL (ref 3.5–5.7)
ALKALINE PHOSPHATASE: 77 U/L (ref 34–104)
ALT (SGPT): 16 U/L (ref 7–52)
ANION GAP: 8 mmol/L (ref 4–13)
AST (SGOT): 18 U/L (ref 13–39)
BILIRUBIN TOTAL: 0.6 mg/dL (ref 0.3–1.0)
BUN/CREA RATIO: 17 (ref 6–22)
BUN: 13 mg/dL (ref 7–25)
CALCIUM, CORRECTED: 8.9 mg/dL (ref 8.9–10.8)
CALCIUM: 9.3 mg/dL (ref 8.6–10.3)
CHLORIDE: 109 mmol/L — ABNORMAL HIGH (ref 98–107)
CO2 TOTAL: 26 mmol/L (ref 21–31)
CREATININE: 0.75 mg/dL (ref 0.60–1.30)
ESTIMATED GFR: 98 mL/min/{1.73_m2} (ref >59)
GLOBULIN: 3.2 (ref 2.9–5.4)
GLUCOSE: 111 mg/dL — ABNORMAL HIGH (ref 74–109)
OSMOLALITY, CALCULATED: 286 mosm/kg (ref 270–290)
POTASSIUM: 3.9 mmol/L (ref 3.5–5.1)
PROTEIN TOTAL: 7.7 g/dL (ref 6.4–8.9)
SODIUM: 143 mmol/L (ref 136–145)

## 2023-08-12 LAB — CBC
HCT: 37 % (ref 31.2–41.9)
HGB: 12.5 g/dL (ref 10.9–14.3)
MCH: 29.1 pg (ref 24.7–32.8)
MCHC: 33.8 g/dL (ref 32.3–35.6)
MCV: 86.1 fL (ref 75.5–95.3)
MPV: 8.1 fL (ref 7.9–10.8)
PLATELETS: 302 10*3/uL (ref 140–440)
RBC: 4.29 10*6/uL (ref 3.63–4.92)
RDW: 12.9 % (ref 12.3–17.7)
WBC: 4.9 10*3/uL (ref 3.8–11.8)

## 2023-08-12 LAB — CARCINOEMBRYONIC ANTIGEN: CEA: 0.8 ng/mL (ref <=5.0)

## 2023-11-18 ENCOUNTER — Ambulatory Visit (INDEPENDENT_AMBULATORY_CARE_PROVIDER_SITE_OTHER): Payer: Self-pay | Admitting: NURSE PRACTITIONER

## 2023-11-18 ENCOUNTER — Ambulatory Visit (INDEPENDENT_AMBULATORY_CARE_PROVIDER_SITE_OTHER): Payer: Self-pay

## 2023-12-01 ENCOUNTER — Other Ambulatory Visit (HOSPITAL_COMMUNITY): Payer: Self-pay | Admitting: NURSE PRACTITIONER

## 2023-12-01 ENCOUNTER — Ambulatory Visit (INDEPENDENT_AMBULATORY_CARE_PROVIDER_SITE_OTHER): Payer: Self-pay

## 2023-12-01 ENCOUNTER — Ambulatory Visit (INDEPENDENT_AMBULATORY_CARE_PROVIDER_SITE_OTHER): Payer: Self-pay | Admitting: NURSE PRACTITIONER

## 2023-12-01 DIAGNOSIS — R599 Enlarged lymph nodes, unspecified: Secondary | ICD-10-CM

## 2023-12-11 ENCOUNTER — Ambulatory Visit (HOSPITAL_COMMUNITY): Payer: Self-pay

## 2023-12-13 ENCOUNTER — Ambulatory Visit (INDEPENDENT_AMBULATORY_CARE_PROVIDER_SITE_OTHER): Payer: Self-pay

## 2023-12-13 ENCOUNTER — Ambulatory Visit (INDEPENDENT_AMBULATORY_CARE_PROVIDER_SITE_OTHER): Payer: Self-pay | Admitting: NURSE PRACTITIONER

## 2023-12-14 ENCOUNTER — Ambulatory Visit (HOSPITAL_COMMUNITY): Payer: Medicare Other

## 2023-12-30 ENCOUNTER — Ambulatory Visit: Payer: Self-pay | Attending: NURSE PRACTITIONER | Admitting: NURSE PRACTITIONER

## 2023-12-30 ENCOUNTER — Other Ambulatory Visit: Payer: Self-pay

## 2023-12-30 ENCOUNTER — Ambulatory Visit (INDEPENDENT_AMBULATORY_CARE_PROVIDER_SITE_OTHER)
Admission: RE | Admit: 2023-12-30 | Discharge: 2023-12-30 | Disposition: A | Discharge status: Home / Self Care | Payer: Self-pay | Source: Ambulatory Visit | Attending: NURSE PRACTITIONER

## 2023-12-30 ENCOUNTER — Ambulatory Visit (INDEPENDENT_AMBULATORY_CARE_PROVIDER_SITE_OTHER): Payer: Self-pay | Admitting: NURSE PRACTITIONER

## 2023-12-30 ENCOUNTER — Encounter (INDEPENDENT_AMBULATORY_CARE_PROVIDER_SITE_OTHER): Payer: Self-pay | Admitting: NURSE PRACTITIONER

## 2023-12-30 VITALS — BP 135/85 | HR 72 | Temp 98.1°F | Ht 66.0 in | Wt 162.8 lb

## 2023-12-30 DIAGNOSIS — Z9049 Acquired absence of other specified parts of digestive tract: Secondary | ICD-10-CM | POA: Insufficient documentation

## 2023-12-30 DIAGNOSIS — C20 Malignant neoplasm of rectum: Secondary | ICD-10-CM

## 2023-12-30 DIAGNOSIS — Z85048 Personal history of other malignant neoplasm of rectum, rectosigmoid junction, and anus: Secondary | ICD-10-CM | POA: Insufficient documentation

## 2023-12-30 DIAGNOSIS — Z08 Encounter for follow-up examination after completed treatment for malignant neoplasm: Secondary | ICD-10-CM | POA: Insufficient documentation

## 2023-12-30 LAB — COMPREHENSIVE METABOLIC PANEL, NON-FASTING
ALBUMIN/GLOBULIN RATIO: 1.4 (ref 0.8–1.4)
ALBUMIN: 4.3 g/dL (ref 3.5–5.7)
ALKALINE PHOSPHATASE: 84 U/L (ref 34–104)
ALT (SGPT): 15 U/L (ref 7–52)
ANION GAP: 5 mmol/L (ref 4–13)
AST (SGOT): 17 U/L (ref 13–39)
BILIRUBIN TOTAL: 0.4 mg/dL (ref 0.3–1.0)
BUN/CREA RATIO: 15 (ref 6–22)
BUN: 12 mg/dL (ref 7–25)
CALCIUM, CORRECTED: 9.1 mg/dL (ref 8.9–10.8)
CALCIUM: 9.3 mg/dL (ref 8.6–10.3)
CHLORIDE: 107 mmol/L (ref 98–107)
CO2 TOTAL: 27 mmol/L (ref 21–31)
CREATININE: 0.78 mg/dL (ref 0.60–1.30)
ESTIMATED GFR: 93 mL/min/{1.73_m2} (ref >59)
GLOBULIN: 3 (ref 2.0–3.5)
GLUCOSE: 102 mg/dL (ref 74–109)
OSMOLALITY, CALCULATED: 278 mosm/kg (ref 270–290)
POTASSIUM: 4.5 mmol/L (ref 3.5–5.1)
PROTEIN TOTAL: 7.3 g/dL (ref 6.4–8.9)
SODIUM: 139 mmol/L (ref 136–145)

## 2023-12-30 LAB — CBC WITH DIFF
BASOPHIL #: 0 10*3/uL (ref 0.00–0.10)
BASOPHIL %: 1 % (ref 0–1)
EOSINOPHIL #: 0.1 10*3/uL (ref 0.00–0.50)
EOSINOPHIL %: 3 % (ref 1–7)
HCT: 37.5 % (ref 31.2–41.9)
HGB: 12.6 g/dL (ref 10.9–14.3)
LYMPHOCYTE #: 1.3 10*3/uL (ref 1.10–3.10)
LYMPHOCYTE %: 33 % (ref 16–46)
MCH: 29.2 pg (ref 24.7–32.8)
MCHC: 33.7 g/dL (ref 32.3–35.6)
MCV: 86.6 fL (ref 75.5–95.3)
MONOCYTE #: 0.4 10*3/uL (ref 0.20–0.90)
MONOCYTE %: 10 % (ref 4–11)
MPV: 8.3 fL (ref 7.9–10.8)
NEUTROPHIL #: 2.1 10*3/uL (ref 1.90–8.20)
NEUTROPHIL %: 53 % (ref 43–77)
PLATELETS: 294 10*3/uL (ref 140–440)
RBC: 4.33 10*6/uL (ref 3.63–4.92)
RDW: 13.1 % (ref 12.3–17.7)
WBC: 4.1 10*3/uL (ref 3.8–11.8)

## 2023-12-30 LAB — CARCINOEMBRYONIC ANTIGEN: CEA: 0.7 ng/mL (ref <=5.0)

## 2023-12-30 NOTE — Cancer Center Note (Signed)
 Department of Hematology/Oncology  Progress Note   Name: Shirley Hudson  UEA:[id number]  Date of Birth: Jan 12, 1976  Encounter Date: 12/30/2023    REFERRING PROVIDER:  Yaakov Guthrie, FNP  [address]  [place],  [place] [address]     REASON FOR OFFICE VISIT:  Rectal Cancer     HISTORY OF PRESENT ILLNESS:  Shirley Hudson is a 48 y.o. female who presents today for follow up of rectal cancer. She was originally diagnosed in 2017, and states that hematochezia had been present for approximately the prior 2 years. In May 2017 she underwent an LAR surgery and was found to have a 2.2 cm primary lesion. 17 lymph nodes were removed and 1 was found to be involved. She was therefore stage III (T1N1aM0) and received adjuvant FOLFOX chemotherapy.     She has also had a couple of breast lesions that have been monitored. She states that there has been some discussion about whether to biopsy at least one of them. She says that she has follow up in the near future for that issue.     05/27/2022: The patient is here for follow up of rectal cancer.  She has been doing well clinically and denies any new problems.  She states that she is due for a colonoscopy in the near future and will be following up with GI.    12/24/2022: The patient is here for follow up of colorectal cancer.  She is about 5 years out from completion of therapy.  She is doing well and denies any new clinical symptoms at this time.    08/12/23:    Patient is here for follow up of colorectal cancer.  She is 6 years out from the follow up of rectal cancer.  She reports that she is having some left upper quadrant pain.  She has not been able to have a good bowel movement this week as she can not take the bowel medication due to not being home.   She reports her ears feel like they have fluid in them.     12/30/23:  Patient is here for follow up on colorectal cancer.  She is 5 years out from diagnosis.  She reports she will  be having a hysterectomy and was referred to [facility]  for this.  Otherwise she is doing well.         ROS:   Pertinent review of systems as discussed in HPI    HISTORY:  Past Medical History:   Diagnosis Date    Afib (CMS HCC)     Malignant neoplasm of colon (CMS HCC)          Past Surgical History:   Procedure Laterality Date    CESAREAN SECTION      DILATION AND CURETTAGE, DIAGNOSTIC / THERAPEUTIC      HX BACK SURGERY      HX COLONOSCOPY      HX HEMICOLECTOMY           Social History     Socioeconomic History    Marital status: Married     Spouse name: Not on file    Number of children: Not on file    Years of education: Not on file    Highest education level: Not on file   Occupational History    Not on file   Tobacco Use    Smoking status: Never    Smokeless tobacco: Never   Vaping Use    Vaping status: Never Used  Substance and Sexual Activity    Alcohol use: Never    Drug use: Never    Sexual activity: Not on file   Other Topics Concern    Not on file   Social History Narrative    Not on file     Social Determinants of Health     Financial Resource Strain: Not on file   Transportation Needs: Not on file   Social Connections: Not on file   Intimate Partner Violence: Not on file   Housing Stability: Not on file     Family Medical History:       Problem Relation (Age of Onset)    Lung Cancer Father, Maternal Grandfather    Throat cancer Paternal Grandfather            Current Outpatient Medications   Medication Sig    albuterol sulfate (PROVENTIL OR VENTOLIN OR PROAIR) 90 mcg/actuation Inhalation oral inhaler ProAir HFA 90 mcg/actuation aerosol inhaler    ALPRAZolam (XANAX) 0.5 mg Oral Tablet     bethanechol chloride (URECHOLINE) 5 mg Oral Tablet     busPIRone (BUSPAR) 10 mg Oral Tablet     cetirizine (ZYRTEC) 10 mg Oral Tablet     ergocalciferol, vitamin D2, (DRISDOL) 1,250 mcg (50,000 unit) Oral Capsule     esomeprazole magnesium (NEXIUM) 40 mg Oral Capsule, Delayed Release(E.C.)     famotidine (PEPCID) 40 mg Oral Tablet     fluticasone propionate (FLONASE) 50  mcg/actuation Nasal Spray, Suspension     gabapentin (NEURONTIN) 100 mg Oral Capsule     metoprolol succinate (TOPROL-XL) 25 mg Oral Tablet Sustained Release 24 hr     montelukast (SINGULAIR) 10 mg Oral Tablet     pravastatin (PRAVACHOL) 40 mg Oral Tablet     sennosides-docusate sodium (SENOKOT-S) 8.6-50 mg Oral Tablet Take 1 Tablet by mouth Every evening     No Known Allergies    PHYSICAL EXAM:  BP 135/85 (Site: Left Arm, Patient Position: Sitting, Cuff Size: Adult)   Pulse 72   Temp 36.7 C (98.1 F) (Temporal)   Ht 1.676 m (5\' 6" )   Wt 73.8 kg (162 lb 12.8 oz)   SpO2 96%   BMI 26.28 kg/m        ECOG Status: (0) Fully active, able to carry on all predisease performance without restriction   Physical Exam  Vitals reviewed.   Constitutional:       Appearance: Normal appearance. She is normal weight.   Cardiovascular:      Rate and Rhythm: Normal rate and regular rhythm.      Pulses: Normal pulses.      Heart sounds: Normal heart sounds.   Pulmonary:      Effort: Pulmonary effort is normal.      Breath sounds: Normal breath sounds.   Abdominal:      General: Bowel sounds are normal.      Palpations: Abdomen is soft.   Lymphadenopathy:      Head:      Right side of head: No submental, submandibular, tonsillar, preauricular, posterior auricular or occipital adenopathy.      Left side of head: No submental, submandibular, tonsillar, preauricular, posterior auricular or occipital adenopathy.      Cervical: No cervical adenopathy.      Right cervical: No superficial, deep or posterior cervical adenopathy.     Left cervical: No superficial, deep or posterior cervical adenopathy.      Upper Body:      Right upper body:  No supraclavicular or axillary adenopathy.      Left upper body: No supraclavicular or axillary adenopathy.   Skin:     General: Skin is warm and dry.      Capillary Refill: Capillary refill takes less than 2 seconds.   Neurological:      Mental Status: She is alert and oriented to person, place, and  time. Mental status is at baseline.         DIAGNOSTIC DATA:  No results found for this or any previous visit (from the past 86578 hours).    LABS:   CBC  Diff   Lab Results   Component Value Date/Time    WBC 4.1 12/30/2023 10:13 AM    HGB 12.6 12/30/2023 10:13 AM    HCT 37.5 12/30/2023 10:13 AM    PLTCNT 294 12/30/2023 10:13 AM    RBC 4.33 12/30/2023 10:13 AM    MCV 86.6 12/30/2023 10:13 AM    MCHC 33.7 12/30/2023 10:13 AM    MCH 29.2 12/30/2023 10:13 AM    RDW 13.1 12/30/2023 10:13 AM    MPV 8.3 12/30/2023 10:13 AM    Lab Results   Component Value Date/Time    PMNS 53 12/30/2023 10:13 AM    LYMPHOCYTES 33 12/30/2023 10:13 AM    EOSINOPHIL 3 12/30/2023 10:13 AM    MONOCYTES 10 12/30/2023 10:13 AM    BASOPHILS 1 12/30/2023 10:13 AM    BASOPHILS 0.00 12/30/2023 10:13 AM    PMNABS 2.10 12/30/2023 10:13 AM    LYMPHSABS 1.30 12/30/2023 10:13 AM    EOSABS 0.10 12/30/2023 10:13 AM    MONOSABS 0.40 12/30/2023 10:13 AM            Comprehensive Metabolic Profile    Lab Results   Component Value Date    SODIUM 143 08/12/2023    POTASSIUM 3.9 08/12/2023    CHLORIDE 109 (H) 08/12/2023    CO2 26 08/12/2023    ANIONGAP 8 08/12/2023    BUN 13 08/12/2023    CREATININE 0.75 08/12/2023    ALBUMIN 4.5 08/12/2023    CALCIUM 9.3 08/12/2023    GLUCOSENF 111 (H) 08/12/2023    ALKPHOS 77 08/12/2023    ALT 16 08/12/2023    AST 18 08/12/2023    TOTBILIRUBIN 0.6 08/12/2023    TOTALPROTEIN 7.7 08/12/2023         ESTIMATED GFR   Date Value Ref Range Status   08/12/2023 98 >59 mL/min/1.38m^2 Final                ASSESSMENT:  Problem List Items Addressed This Visit    None  Visit Diagnoses         Rectal cancer (CMS HCC)    -  Primary    Relevant Orders    CBC/DIFF    COMPREHENSIVE METABOLIC PANEL, NON-FASTING    CARCINOEMBRYONIC ANTIGEN                 ICD-10-CM    1. Rectal cancer (CMS HCC)  C20 CBC/DIFF     COMPREHENSIVE METABOLIC PANEL, NON-FASTING     CARCINOEMBRYONIC ANTIGEN               PLAN:   1. All relevant medical records were  reviewed including available pertinent provider notes, procedure notes, imaging, laboratory, and pathology.   2. All pertinent labs and/or imaging were reviewed with the patient.   3. Rectal cancer: She is more than 5 years out from that diagnosis, but  retains a high level of concern.  We reviewed her most recent labs that I have in the system and we will tentatively plan on seeing her back every 6 months for now for ongoing surveillance.       Shirley Hudson was given the chance to ask questions, and these were answered to their satisfaction. The patient is welcome to call with any questions or concerns in the meantime.      Return in about 6 months (around 07/01/2024).   Marvene Staff APRN, FNP-BC, AOCNP, 12/30/2023 , 10:50   You can see your note(s) in [facility]. It is common for you to encounter certain medical terminology which may be unfamiliar to you. You might see results before your provider does so please give at least 2 business days for review. Please have this understanding, that NOT all abnormal results are significant. Our office will contact you for any urgent or emergent action if necessary. If you have any questions or concerns, feel free to send a MyChart message or call the office. Please call with any new or concerning symptoms.       CC:  Yaakov Guthrie, FNP  [address]  [place] [address]    Yaakov Guthrie, FNP  [address]  [place],  [place] [address]    This note was partially generated using MModal Fluency Direct system, and there may be some incorrect words, spellings, and punctuation that were not noted in checking the note before saving.

## 2024-01-03 ENCOUNTER — Ambulatory Visit: Payer: Self-pay | Attending: OTOLARYNGOLOGY | Admitting: OTOLARYNGOLOGY

## 2024-01-03 ENCOUNTER — Encounter (INDEPENDENT_AMBULATORY_CARE_PROVIDER_SITE_OTHER): Payer: Self-pay | Admitting: OTOLARYNGOLOGY

## 2024-01-03 ENCOUNTER — Other Ambulatory Visit: Payer: Self-pay

## 2024-01-03 VITALS — Ht 66.0 in | Wt 162.0 lb

## 2024-01-03 DIAGNOSIS — J342 Deviated nasal septum: Secondary | ICD-10-CM | POA: Insufficient documentation

## 2024-01-03 DIAGNOSIS — J358 Other chronic diseases of tonsils and adenoids: Secondary | ICD-10-CM | POA: Insufficient documentation

## 2024-01-03 DIAGNOSIS — J3489 Other specified disorders of nose and nasal sinuses: Secondary | ICD-10-CM | POA: Insufficient documentation

## 2024-01-03 MED ORDER — MUPIROCIN 2 % TOPICAL OINTMENT
TOPICAL_OINTMENT | Freq: Two times a day (BID) | CUTANEOUS | 1 refills | Status: AC
Start: 2024-01-03 — End: ?

## 2024-01-03 MED ORDER — AMOXICILLIN 875 MG-POTASSIUM CLAVULANATE 125 MG TABLET
1.0000 | ORAL_TABLET | Freq: Two times a day (BID) | ORAL | 0 refills | Status: DC
Start: 2024-01-03 — End: 2024-07-13

## 2024-01-03 NOTE — H&P (Signed)
 [facility]  [address]  [place] [address]  Operated by [facility]  New Patient Visit    Name: Shirley Hudson MRN:  [id number]   Date: 01/03/2024 DOB: Nov 04, 1975 (47 y.o.)          Referring Provider:    Yaakov Guthrie, FNP  [address]  [place],  [place] [address]    Reason for Visit:   Chief Complaint   Patient presents with    Tonsil Hypertrophy     Complains of enlarged right tonsil for the past 2-3 months.        History of Present Illness:  Shirley Hudson is a 48 y.o. female who is referred for eval of tonsils.   Pt c/o swollen R tonsil x 2 months. Denies sore throat, neck mass, weight loss, tobacco, Strep.. Pt has concerns d/t h/o colon cancer in 2017.   Infrequently has dysphagia. She also c/o soreness of bilat nares. It feels very dry.     Patient History:  There is no problem list on file for this patient.    Current Outpatient Medications   Medication Sig    albuterol sulfate (PROVENTIL OR VENTOLIN OR PROAIR) 90 mcg/actuation Inhalation oral inhaler ProAir HFA 90 mcg/actuation aerosol inhaler    ALPRAZolam (XANAX) 0.5 mg Oral Tablet     amoxicillin-pot clavulanate (AUGMENTIN) 875-125 mg Oral Tablet Take 1 Tablet by mouth Every 12 hours    bethanechol chloride (URECHOLINE) 5 mg Oral Tablet     busPIRone (BUSPAR) 10 mg Oral Tablet     cetirizine (ZYRTEC) 10 mg Oral Tablet     ergocalciferol, vitamin D2, (DRISDOL) 1,250 mcg (50,000 unit) Oral Capsule     esomeprazole magnesium (NEXIUM) 40 mg Oral Capsule, Delayed Release(E.C.)     famotidine (PEPCID) 40 mg Oral Tablet     fluticasone propionate (FLONASE) 50 mcg/actuation Nasal Spray, Suspension     gabapentin (NEURONTIN) 100 mg Oral Capsule     metoprolol succinate (TOPROL-XL) 25 mg Oral Tablet Sustained Release 24 hr     montelukast (SINGULAIR) 10 mg Oral Tablet     mupirocin (BACTROBAN) 2 % Ointment Apply topically Twice daily    pravastatin (PRAVACHOL) 40 mg Oral Tablet     sennosides-docusate sodium (SENOKOT-S) 8.6-50 mg  Oral Tablet Take 1 Tablet by mouth Every evening     No Known Allergies  Past Medical History:   Diagnosis Date    Afib (CMS HCC)     Malignant neoplasm of colon (CMS HCC)       Past Surgical History:   Procedure Laterality Date    CESAREAN SECTION      DILATION AND CURETTAGE, DIAGNOSTIC / THERAPEUTIC      HX BACK SURGERY      HX COLONOSCOPY      HX HEMICOLECTOMY        Family Medical History:       Problem Relation (Age of Onset)    Lung Cancer Father, Maternal Grandfather    Throat cancer Paternal Grandfather            Social History     Tobacco Use    Smoking status: Never    Smokeless tobacco: Never   Vaping Use    Vaping status: Never Used   Substance Use Topics    Alcohol use: Never    Drug use: Never       Review of Systems:  Review of Systems    Physical Exam:  Ht 1.676  m (5\' 6" )   Wt 73.5 kg (162 lb)   BMI 26.15 kg/m       ENT Physical Exam  Constitutional  Appearance: patient appears well-developed, well-nourished and well-groomed,  Communication/Voice: communication appropriate for developmental age; vocal quality normal;  Head and Face  Appearance: head appears normal, face appears normal and face appears atraumatic;  Palpation: facial palpation normal;  Salivary: glands normal;  Ear  Hearing: intact;  Auricles: right auricle normal; left auricle normal;  External Mastoids: right external mastoid normal; left external mastoid normal;  Ear Canals: right ear canal normal; left ear canal normal;  Tympanic Membranes: right tympanic membrane normal; left tympanic membrane normal; Tympanic Membrane comments: AS tympanosclerosis   Nose  External Nose: nares patent bilaterally; external nose normal;  Internal Nose: nasal mucosa normal; septum normal; bilateral inferior turbinates normal;  Oral Cavity/Oropharynx  Lips: normal;  Teeth: normal;  Gums: gingiva normal;  Tongue: normal;  Oral mucosa: normal;  Hard palate: normal;  Tonsils: right tonsil 3+; left tonsil 2+; Tonsil comments: smooth tonsils with mild  asymmetry  Neck  Neck: neck normal; neck palpation normal;  Thyroid: thyroid normal;  Respiratory  Inspection: breathing unlabored; normal breathing rate;  Lymphatic  Palpation: lymph nodes normal;  Neurovestibular  Mental Status: alert and oriented;  Psychiatric: mood normal; affect is appropriate;  Cranial Nerves: cranial nerves intact;       Assessment:  ENCOUNTER DIAGNOSES     ICD-10-CM   1. Asymmetric tonsils  J35.8   2. Nasal vestibulitis  J34.89   3. DNS (deviated nasal septum)  J34.2       Plan:  Medical records reviewed on 01/03/2024.  Rx mupirocin. There is mild asymmetrical, R>L, tonsils. Will give a course of Augmentin and will check CT of the neck.   Orders Placed This Encounter    31575 - LARYNGOSCOPY, FLEXIBLE DIAGNOSTIC (AMB ONLY)    CT SOFT TISSUE NECK W IV CONTRAST    mupirocin (BACTROBAN) 2 % Ointment    amoxicillin-pot clavulanate (AUGMENTIN) 875-125 mg Oral Tablet     Return for Follow up after CT.    Marcelline Deist, PA-C

## 2024-01-03 NOTE — Procedures (Signed)
 [facility]  [address]  [place] [address]  Operated by [facility]  Procedure Note    Name: Shirley Hudson MRN:  [id number]   Date: 01/03/2024 DOB:  13-Jul-1976 (47 y.o.)         31575 - LARYNGOSCOPY, FLEXIBLE DIAGNOSTIC (AMB ONLY)    Performed by: Conchita Paris, DO  Authorized by: Conchita Paris, DO    Time Out:     Immediately before the procedure, a time out was called:  Yes    Patient verified:  Yes    Procedure Verified:  Yes    Site Verified:  Yes  Documentation:      [facility]  [address]  [place] [address]  Operated by [facility]  Procedure Note    Name: Shirley Hudson MRN:  [id number]  Date: 01/03/2024 DOB:  25-Jun-1976 (48 y.o.)        @PROCDOC @    Indications for procedure: Head / Neck masses    Anesthesia: Oxymetazoline nasal spray    Description: The flexible endoscope was gently introduced into the nostril and passed along the floor of the nose to the nasopharynx. Adenoid was minimal and eustachian tubes normal. The retropalatal airway was patent.    The endoscope was passed to the oropharynx. Base of tongue displayed normal lingual tonsils, patent valelulla, and sharply defined upright epiglottis. Retrolingual airway was patent.    The larynx displayed normal true vocal cords with good mobility. False cords were normal. Arytenoid mucosa was pink with no edema.     The piriform recesses were symmetric without secretion. The hypopharynx was symmetric without lesion.    Findings: Symmetrical true vocal fold motion, no masses or lesions.    The patient tolerated the procedure well.    Conchita Paris, DO                 Emanuel Campos [place], DO

## 2024-01-04 ENCOUNTER — Other Ambulatory Visit (INDEPENDENT_AMBULATORY_CARE_PROVIDER_SITE_OTHER): Payer: Self-pay | Admitting: OTOLARYNGOLOGY

## 2024-01-04 DIAGNOSIS — J3489 Other specified disorders of nose and nasal sinuses: Secondary | ICD-10-CM

## 2024-01-04 DIAGNOSIS — I10 Essential (primary) hypertension: Secondary | ICD-10-CM

## 2024-01-04 DIAGNOSIS — J358 Other chronic diseases of tonsils and adenoids: Secondary | ICD-10-CM

## 2024-01-05 ENCOUNTER — Ambulatory Visit (HOSPITAL_COMMUNITY): Payer: Medicare Other

## 2024-01-24 ENCOUNTER — Ambulatory Visit
Admission: RE | Admit: 2024-01-24 | Discharge: 2024-01-24 | Disposition: A | Discharge status: Home / Self Care | Payer: Self-pay | Source: Ambulatory Visit | Attending: OTOLARYNGOLOGY | Admitting: OTOLARYNGOLOGY

## 2024-01-24 ENCOUNTER — Other Ambulatory Visit: Payer: Self-pay

## 2024-01-24 DIAGNOSIS — J358 Other chronic diseases of tonsils and adenoids: Secondary | ICD-10-CM | POA: Insufficient documentation

## 2024-01-24 MED ORDER — IOHEXOL 350 MG IODINE/ML INTRAVENOUS SOLUTION
100.0000 mL | INTRAVENOUS | Status: AC
Start: 2024-01-24 — End: 2024-01-24
  Administered 2024-01-24: 75 mL via INTRAVENOUS

## 2024-02-09 ENCOUNTER — Ambulatory Visit (INDEPENDENT_AMBULATORY_CARE_PROVIDER_SITE_OTHER): Payer: Self-pay | Admitting: OTOLARYNGOLOGY

## 2024-04-05 ENCOUNTER — Encounter (INDEPENDENT_AMBULATORY_CARE_PROVIDER_SITE_OTHER): Payer: Self-pay | Admitting: OTOLARYNGOLOGY

## 2024-04-05 ENCOUNTER — Other Ambulatory Visit: Payer: Self-pay

## 2024-04-05 ENCOUNTER — Ambulatory Visit: Payer: Self-pay | Attending: OTOLARYNGOLOGY | Admitting: OTOLARYNGOLOGY

## 2024-04-05 VITALS — Ht 66.0 in | Wt 162.0 lb

## 2024-04-05 DIAGNOSIS — J3489 Other specified disorders of nose and nasal sinuses: Secondary | ICD-10-CM | POA: Insufficient documentation

## 2024-04-05 DIAGNOSIS — J342 Deviated nasal septum: Secondary | ICD-10-CM | POA: Insufficient documentation

## 2024-04-05 DIAGNOSIS — J358 Other chronic diseases of tonsils and adenoids: Secondary | ICD-10-CM | POA: Insufficient documentation

## 2024-04-05 NOTE — H&P (Signed)
 [facility]  [address]  [place] [address]  Operated by [facility]  Progress Note    Name: Shirley Hudson MRN:  [id number]   Date: 04/05/2024 DOB:  07/18/1976 (47 y.o.)              Follow Up      Subjective:   Chief Complaint:   Follow-up After Testing (Rc after CT)       History of Present Illness:  Shirley Hudson is a 48 y.o. old female who presents to the clinic for follow-up after CT with results below.  She states bactroban  is improving her nose.       PACS Images     Show images for CT SOFT TISSUE NECK W IV CONTRAST    [facility]   [address]  [place] [address]   Phone: [phone] Fax: [phone]     PATIENT NAME: Shirley Hudson MED [facility] NO: [id number]   BIRTH DATE: [birth year] ORDER: [id number]   SEX: F ORDER FROM: [facility]   REQUESTING PHYS: Norah Beals SERVICE DATE: 01/24/2024 10:04    ATTENDING Audrey Leash: Norah Beals REPORT DATE: 01/24/2024 10:41   REASON: Asymmetric tonsils       Final  CT SOFT TISSUE NECK W IV CONTRAST                          Study Result    Narrative & Impression   Shirley Hudson     RADIOLOGIST: Orpah Blackwater     CT SOFT TISSUE NECK W IV CONTRAST performed on 01/24/2024 10:04 AM     CLINICAL HISTORY: J35.8: Asymmetric tonsils.  Right tonsil swelling for 2 months.     TECHNIQUE: CT imaging of the soft tissues of the neck from the orbits to the upper mediastinum with intravenous contrast.    CONTRAST:  75 ml's of Omnipaque  350     COMPARISON: None.        FINDINGS:  Airway: Midline and patent.     Pharyngeal mucosal space: Streak artifact from dental fillings slightly limiting evaluation of the oral cavity.      Minimally prominent right palatine tonsil compared to the left, unchanged from 2021. No inflammatory changes. No tonsillar mass or abscess. Unremarkable lingual tonsils.       Hypopharynx and larynx:  Unremarkable. Symmetrically adducted vocal cords.       Parapharyngeal and retropharyngeal spaces:  Unremarkable.     Masticator and buccal spaces:  Unremarkable.     Salivary glands: Unremarkable.     Lymph nodes:  No cervical lymphadenopathy.     Thyroid:  Unremarkable     Vasculature:  Carotid arteries and internal jugular veins are unremarkable.      Orbits:  Unremarkable at visualized levels.     Paranasal sinuses and mastoids: Grossly clear at visualized levels.     Lung apices:  Clear.  Upper mediastinum: Visualized mediastinum is unremarkable.  Bones: Unremarkable.         IMPRESSION:     No acute or concerning findings.     Slightly asymmetric size of the palatine tonsils, right more prominent than left, unchanged from 2021 and without underlying lesion or inflammatory changes, likely normal anatomical variant.        One or more dose reduction techniques were used (e.g., Automated exposure control, adjustment of the mA and/or kV according to patient size, use of  iterative reconstruction technique).        Radiologist location ID: [id number]         Review of Systems     Physical Exam:     Vitals:    04/05/24 1210   Weight: 73.5 kg (162 lb)   Height: 1.676 m (5' 6)   BMI: 26.15      ENT Physical Exam  Constitutional  Appearance: patient appears well-developed, well-nourished and well-groomed,  Communication/Voice: communication appropriate for developmental age; vocal quality normal;  Head and Face  Appearance: head appears normal, face appears normal and face appears atraumatic;  Palpation: facial palpation normal;  Salivary: glands normal;  Ear  Hearing: intact;  Auricles: right auricle normal; left auricle normal;  External Mastoids: right external mastoid normal; left external mastoid normal;  Ear Canals: right ear canal normal; left ear canal normal;  Tympanic Membranes: right tympanic membrane normal; left tympanic membrane normal; Tympanic Membrane comments: AS tympanosclerosis   Nose  External Nose: nares patent bilaterally; external nose normal;  Internal Nose: nasal mucosa normal; septum  normal; bilateral inferior turbinates normal;  Oral Cavity/Oropharynx  Lips: normal;  Teeth: normal;  Gums: gingiva normal;  Tongue: normal;  Oral mucosa: normal;  Hard palate: normal;  Tonsils: right tonsil 3+; left tonsil 2+; Tonsil comments: smooth tonsils with mild asymmetry  Neck  Neck: neck normal; neck palpation normal;  Thyroid: thyroid normal;  Respiratory  Inspection: breathing unlabored; normal breathing rate;  Lymphatic  Palpation: lymph nodes normal;  Neurovestibular  Mental Status: alert and oriented;  Psychiatric: mood normal; affect is appropriate;  Cranial Nerves: cranial nerves intact;       Assessment and Plan:       ICD-10-CM    1. Asymmetric tonsils  J35.8       2. Nasal vestibulitis  J34.89       3. DNS (deviated nasal septum)  J34.2         Will continue bactroban   CT reviewed and unchanged since 2021 will monitor.       Follow up:  Return in about 6 months (around 10/05/2024).    Norah Beals, DO

## 2024-07-13 ENCOUNTER — Ambulatory Visit (INDEPENDENT_AMBULATORY_CARE_PROVIDER_SITE_OTHER)
Admission: RE | Admit: 2024-07-13 | Discharge: 2024-07-13 | Disposition: A | Discharge status: Home / Self Care | Payer: Self-pay | Source: Ambulatory Visit | Attending: NURSE PRACTITIONER

## 2024-07-13 ENCOUNTER — Ambulatory Visit (INDEPENDENT_AMBULATORY_CARE_PROVIDER_SITE_OTHER): Payer: Self-pay | Admitting: NURSE PRACTITIONER

## 2024-07-13 ENCOUNTER — Other Ambulatory Visit: Payer: Self-pay

## 2024-07-13 ENCOUNTER — Encounter (INDEPENDENT_AMBULATORY_CARE_PROVIDER_SITE_OTHER): Payer: Self-pay | Admitting: NURSE PRACTITIONER

## 2024-07-13 ENCOUNTER — Ambulatory Visit: Payer: Self-pay | Attending: NURSE PRACTITIONER | Admitting: NURSE PRACTITIONER

## 2024-07-13 VITALS — BP 112/73 | HR 72 | Temp 97.4°F | Ht 66.0 in | Wt 146.0 lb

## 2024-07-13 DIAGNOSIS — C20 Malignant neoplasm of rectum: Secondary | ICD-10-CM

## 2024-07-13 DIAGNOSIS — R5383 Other fatigue: Secondary | ICD-10-CM | POA: Insufficient documentation

## 2024-07-13 DIAGNOSIS — Z85048 Personal history of other malignant neoplasm of rectum, rectosigmoid junction, and anus: Secondary | ICD-10-CM

## 2024-07-13 DIAGNOSIS — K5900 Constipation, unspecified: Secondary | ICD-10-CM

## 2024-07-13 LAB — COMPREHENSIVE METABOLIC PANEL, NON-FASTING
ALBUMIN/GLOBULIN RATIO: 1.4 (ref 0.8–1.4)
ALBUMIN: 4.2 g/dL (ref 3.5–5.7)
ALKALINE PHOSPHATASE: 70 U/L (ref 34–104)
ALT (SGPT): 13 U/L (ref 7–52)
ANION GAP: 7 mmol/L (ref 4–13)
AST (SGOT): 18 U/L (ref 13–39)
BILIRUBIN TOTAL: 0.5 mg/dL (ref 0.3–1.0)
BUN/CREA RATIO: 14 (ref 6–22)
BUN: 12 mg/dL (ref 7–25)
CALCIUM, CORRECTED: 9.4 mg/dL (ref 8.9–10.8)
CALCIUM: 9.6 mg/dL (ref 8.6–10.3)
CHLORIDE: 105 mmol/L (ref 98–107)
CO2 TOTAL: 28 mmol/L (ref 21–31)
CREATININE: 0.84 mg/dL (ref 0.60–1.30)
ESTIMATED GFR: 85 mL/min/1.73mˆ2 (ref >59)
GLOBULIN: 3.1 (ref 2.0–3.5)
GLUCOSE: 88 mg/dL (ref 74–109)
OSMOLALITY, CALCULATED: 279 mosm/kg (ref 270–290)
POTASSIUM: 4.5 mmol/L (ref 3.5–5.1)
PROTEIN TOTAL: 7.3 g/dL (ref 6.4–8.9)
SODIUM: 140 mmol/L (ref 136–145)

## 2024-07-13 LAB — IRON TRANSFERRIN AND TIBC
IRON (TRANSFERRIN) SATURATION: 30 % (ref 15–50)
IRON: 96 ug/dL (ref 50–212)
TOTAL IRON BINDING CAPACITY: 323 ug/dL (ref 250–450)
TRANSFERRIN: 231 mg/dL (ref 203–362)
UIBC: 227 ug/dL (ref 130–375)

## 2024-07-13 LAB — CARCINOEMBRYONIC ANTIGEN: CEA: 0.6 ng/mL (ref <=5.0)

## 2024-07-13 LAB — CBC WITH DIFF
BASOPHIL #: 0.1 x10ˆ3/uL (ref 0.00–0.10)
BASOPHIL %: 1 % (ref 0–1)
EOSINOPHIL #: 0.1 x10ˆ3/uL (ref 0.00–0.50)
EOSINOPHIL %: 3 % (ref 1–7)
HCT: 35.1 % (ref 31.2–41.9)
HGB: 12.3 g/dL (ref 10.9–14.3)
LYMPHOCYTE #: 1.6 x10ˆ3/uL (ref 1.10–3.10)
LYMPHOCYTE %: 35 % (ref 16–46)
MCH: 29.9 pg (ref 24.7–32.8)
MCHC: 35.1 g/dL (ref 32.3–35.6)
MCV: 85.1 fL (ref 75.5–95.3)
MONOCYTE #: 0.5 x10ˆ3/uL (ref 0.20–0.90)
MONOCYTE %: 10 % (ref 4–11)
MPV: 8 fL (ref 7.9–10.8)
NEUTROPHIL #: 2.4 x10ˆ3/uL (ref 1.90–8.20)
NEUTROPHIL %: 51 % (ref 43–77)
PLATELETS: 294 x10ˆ3/uL (ref 140–440)
RBC: 4.13 x10ˆ6/uL (ref 3.63–4.92)
RDW: 12.9 % (ref 12.3–17.7)
WBC: 4.7 x10ˆ3/uL (ref 3.8–11.8)

## 2024-07-13 LAB — VITAMIN B12: VITAMIN B 12: 563 pg/mL (ref 180–914)

## 2024-07-13 LAB — PHOSPHORUS: PHOSPHORUS: 4.1 mg/dL (ref 3.7–7.2)

## 2024-07-13 NOTE — Cancer Center Note (Signed)
 Shirley Hudson  [id number]  [birth year]   07/13/2024       Department of Hematology/Oncology  Return Patient Visit           REFERRING PROVIDER:  Jerral Lukes, FNP  [address]  [place],  [place] [address]    PCP:  Lukes Jerral, FNP  [address]  [place] [address]          REASON FOR OFFICE VISIT:  Ongoing management and evaluation of  rectal cancer      HISTORY OF PRESENT ILLNESS:  History of Present Illness  Shirley Hudson is a 48 year old female with stage III rectal cancer who presents for follow-up.    Rectal cancer surveillance  - Diagnosed with stage III rectal cancer in 2017  - Underwent low anterior resection (LAR) in May 2017 followed by adjuvant FOLFOX chemotherapy  - Colonoscopy initially scheduled for July, postponed to end of October due to scheduling issues  - Recent laboratory studies show normal CBC, kidney, and liver function tests  - Last CEA level was 0.7  - Signatera test in April 2023 was negative    Gastrointestinal symptoms  - Persistent constipation, worsened with use of Zepbound  - No changes in bowel habits aside from constipation  - No nausea, vomiting, or gastrointestinal bleeding    Constitutional symptoms  - Significant fatigue  - Weight loss of over 20 pounds  - Uncertain if fatigue is due to inadequate nutrition or another cause  - No fever or chills    Cardiopulmonary symptoms  - No chest pain or shortness of breath          REVIEW OF SYSTEMS:    Past Medical History:   Diagnosis Date    Afib     Malignant neoplasm of colon            Past Surgical History:   Procedure Laterality Date    CESAREAN SECTION      DILATION AND CURETTAGE, DIAGNOSTIC / THERAPEUTIC      HX BACK SURGERY      HX COLONOSCOPY      HX HEMICOLECTOMY             Social History     Socioeconomic History    Marital status: Married     Spouse name: Not on file    Number of children: Not on file    Years of education: Not on file    Highest education level: Not on file   Occupational History    Not on file    Tobacco Use    Smoking status: Never    Smokeless tobacco: Never   Vaping Use    Vaping status: Never Used   Substance and Sexual Activity    Alcohol use: Never    Drug use: Never    Sexual activity: Not on file   Other Topics Concern    Not on file   Social History Narrative    Not on file     Social Determinants of Health     Financial Resource Strain: Not on file   Transportation Needs: Not on file   Social Connections: Not on file   Intimate Partner Violence: Not on file   Housing Stability: Not on file       Social History     Social History Narrative    Not on file       Social History     Substance and Sexual  Activity   Drug Use Never       Family Medical History:       Problem Relation (Age of Onset)    Lung Cancer Father, Maternal Grandfather    Throat cancer Paternal Grandfather              Current Outpatient Medications   Medication Sig    albuterol sulfate (PROVENTIL OR VENTOLIN OR PROAIR) 90 mcg/actuation Inhalation oral inhaler ProAir HFA 90 mcg/actuation aerosol inhaler    ALPRAZolam (XANAX) 0.5 mg Oral Tablet  (Patient taking differently: When needed)    atenoloL (TENORMIN) 25 mg Oral Tablet     bethanechol chloride (URECHOLINE) 5 mg Oral Tablet     busPIRone (BUSPAR) 10 mg Oral Tablet     cetirizine (ZYRTEC) 10 mg Oral Tablet     clobetasoL (TEMOVATE) 0.05 % Cream As needed    ergocalciferol, vitamin D2, (DRISDOL) 1,250 mcg (50,000 unit) Oral Capsule     esomeprazole magnesium (NEXIUM) 40 mg Oral Capsule, Delayed Release(E.C.)     fluticasone propionate (FLONASE) 50 mcg/actuation Nasal Spray, Suspension     gabapentin (NEURONTIN) 100 mg Oral Capsule     montelukast (SINGULAIR) 10 mg Oral Tablet     mupirocin  (BACTROBAN ) 2 % Ointment Apply topically Twice daily    pravastatin (PRAVACHOL) 40 mg Oral Tablet     PREMARIN 0.625 mg/gram Vaginal Cream As needed    sennosides-docusate sodium (SENOKOT-S) 8.6-50 mg Oral Tablet Take 1 Tablet by mouth Every evening    ZEPBOUND 2.5 mg/0.5 mL Subcutaneous Pen  Injector        Allergies[1]      PHYSICAL EXAM:  BP 112/73 (Site: Left Arm, Patient Position: Sitting, Cuff Size: Adult)   Pulse 72   Temp 36.3 C (97.4 F) (Temporal)   Ht 1.676 m (5' 6)   Wt 66.2 kg (146 lb)   SpO2 99%   BMI 23.57 kg/m        ECOG Status: (0) Fully active, able to carry on all predisease performance without restriction   Physical Exam  GENERAL: Alert, cooperative, well developed, no acute distress.  HEENT: Normocephalic, normal oropharynx, moist mucous membranes.  CHEST: Clear to auscultation bilaterally, no wheezes, rhonchi, or crackles.  CARDIOVASCULAR: Normal heart rate and rhythm, S1 and S2 normal without murmurs.  ABDOMEN: Soft, non-tender, non-distended, without organomegaly, normal bowel sounds.  EXTREMITIES: No cyanosis or edema.  NEUROLOGICAL: Cranial nerves grossly intact, moves all extremities without gross motor or sensory deficit.      LABS:   Results  LABS  CBC: WBC 4.7, Hb 12.3, PLT 294  GFR: 85  K: 4.5  CEA: 0.7             ASSESSMENT:    ICD-10-CM    1. Fatigue, unspecified type  R53.83 IRON TRANSFERRIN AND TIBC     VITAMIN B12     PHOSPHORUS      2. Rectal cancer (CMS HCC)  C20 IRON TRANSFERRIN AND TIBC     VITAMIN B12     PHOSPHORUS     CARCINOEMBRYONIC ANTIGEN           Assessment & Plan  Rectal cancer, status post LAR and adjuvant chemotherapy  Rectal cancer diagnosed in 2017, treated with LAR surgery and adjuvant FOLFOX chemotherapy. Currently, no signs of recurrence. Last CEA was 0.7, within normal limits. Per NCCN guidelines, CEA monitoring is not necessary beyond five years post-treatment.  - Proceed with scheduled colonoscopy in October  - Discuss CEA  testing with her; consider adding if she desires  - Plan for Signatera testing at next visit    Chronic fatigue and unintentional weight loss  Chronic fatigue and unintentional weight loss of 20 pounds, likely multifactorial including possible nutritional deficiency and medication effect from Zepbound (tirzepatide).  Normal CBC, kidney, liver function, and electrolytes. Fatigue may also be exacerbated by personal stressors and responsibilities.  - Order labs for phosphorus, iron, and B12 to assess for nutritional deficiencies  - Consider ferritin level if iron is low  - Communicate lab results via MyChart    Constipation  Constipation reported, worsened by Zepbound (tirzepatide). No other gastrointestinal symptoms reported. She is aware of the impact of medication on bowel habits.      NCCN SURVEILLANCE GUIDELINES FOR RECTAL CANCER OPERATIVE MANAGEMENT:  History and physical examination every 3-6 months for two years then every six months for a total of five years   CEA every 3-6 months for two years and then every six months for total of five years  DRE and proctoscopy or flexible sigmoidoscopy every 3-4 months for two years then every six months for a total of five years  MRI rectum every six months for up to three years  CT chest abdomen every 6-12 months for total of five years,   Colonoscopy at one year following completion of therapy  If  advanced adenoma, repeat in one year  If no advanced adenoma, repeat in three years and then every five years       Return in about 6 months (around 01/10/2025).    Shirley Hudson was given the chance to ask questions, and these were answered to their satisfaction. The patient is welcome to call with any questions or concerns in the meantime.       This note was created using [employer] M*Modal Fluency Align mobile application via conversational audio. Consent for audio recording was obtained by patient/family members prior to recording.      Randine Clara APRN, FNP-BC, AOCNP, 07/13/2024 , 12:10       This note was partially generated using MModal Fluency Direct system, and there may be some incorrect words, spellings, and punctuation that were not noted in checking the note before saving.   You can see your note(s) in [facility]. It is common for you to encounter certain medical terminology  which may be unfamiliar to you. You might see results before your provider does so please give at least 2 business days for review. Please have this understanding, that NOT all abnormal results are significant. Our office will contact you for any urgent or emergent action if necessary. If you have any questions or concerns, feel free to send a MyChart message or call the office. Please call with any new or concerning symptoms.     CC:  Lucie Buerger, FNP  [address]  [place] [address]                           [1] No Known Allergies

## 2024-10-02 ENCOUNTER — Ambulatory Visit (INDEPENDENT_AMBULATORY_CARE_PROVIDER_SITE_OTHER): Payer: Self-pay | Admitting: OTOLARYNGOLOGY

## 2024-11-06 ENCOUNTER — Encounter (INDEPENDENT_AMBULATORY_CARE_PROVIDER_SITE_OTHER): Payer: Self-pay | Admitting: OTOLARYNGOLOGY

## 2024-11-06 ENCOUNTER — Ambulatory Visit: Attending: OTOLARYNGOLOGY | Admitting: OTOLARYNGOLOGY

## 2024-11-06 ENCOUNTER — Other Ambulatory Visit: Payer: Self-pay

## 2024-11-06 VITALS — Ht 66.0 in | Wt 146.0 lb

## 2024-11-06 DIAGNOSIS — J309 Allergic rhinitis, unspecified: Secondary | ICD-10-CM | POA: Insufficient documentation

## 2024-11-06 DIAGNOSIS — H6993 Unspecified Eustachian tube disorder, bilateral: Secondary | ICD-10-CM | POA: Insufficient documentation

## 2024-11-06 DIAGNOSIS — J358 Other chronic diseases of tonsils and adenoids: Secondary | ICD-10-CM | POA: Insufficient documentation

## 2024-11-06 DIAGNOSIS — J3489 Other specified disorders of nose and nasal sinuses: Secondary | ICD-10-CM | POA: Insufficient documentation

## 2024-11-06 DIAGNOSIS — J342 Deviated nasal septum: Secondary | ICD-10-CM | POA: Insufficient documentation

## 2024-11-06 MED ORDER — TRIAMCINOLONE ACETONIDE 55 MCG NASAL SPRAY AEROSOL: 2.0000 | INHALATION_SPRAY | Freq: Every day | NASAL | 3 refills | Status: AC

## 2024-11-06 NOTE — H&P (Signed)
 [facility]  [address]  [place] [address]  Operated by [facility]  Progress Note    Name: Shirley Hudson MRN:  [id number]   Date: 11/06/2024 DOB:  06-01-1976 (49 y.o.)              Follow Up      Subjective:   Chief Complaint:   Follow Up 6 Months (6 month rc on chronic nasal vestibulitis. States currently having fullness in ears at times as well as pain behind ears and headaches. )       History of Present Illness:  Shirley Hudson is a 49 y.o. old female who presents to the clinic for follow-up. Patient states she has been having nasal congestion and aural fullness for several weeks.  She denies any burning in the nose.    Tymps type A Au    Review of Systems     Physical Exam:     Vitals:    11/06/24 0923   Weight: 66.2 kg (146 lb)   Height: 1.676 m (5' 6)   BMI: 23.57      ENT Physical Exam  Constitutional  Appearance: patient appears well-developed, well-nourished and well-groomed,  Communication/Voice: communication appropriate for developmental age; vocal quality normal;  Head and Face  Appearance: head appears normal, face appears normal and face appears atraumatic;  Palpation: facial palpation normal;  Salivary: glands normal;  Ear  Hearing: intact;  Auricles: right auricle normal; left auricle normal;  External Mastoids: right external mastoid normal; left external mastoid normal;  Ear Canals: right ear canal normal; left ear canal normal;  Tympanic Membranes: right tympanic membrane normal; left tympanic membrane normal;  Nose  External Nose: nares patent bilaterally; external nose normal;  Internal Nose: nasal mucosa normal; nasal septal deviation present; bilateral inferior turbinates with hypertrophy;  Oral Cavity/Oropharynx  Lips: normal;  Teeth: normal;  Gums: gingiva normal;  Tongue: normal;  Oral mucosa: normal;  Hard palate: normal;  Neck  Neck: neck normal; neck palpation normal;  Thyroid: thyroid normal;  Respiratory  Inspection: breathing unlabored; normal  breathing rate;  Lymphatic  Palpation: lymph nodes normal;  Neurovestibular  Mental Status: alert and oriented;  Psychiatric: mood normal; affect is appropriate;  Cranial Nerves: cranial nerves intact;       Assessment and Plan:       ICD-10-CM    1. Asymmetric tonsils  J35.8       2. Nasal vestibulitis  J34.89 31231 - NASAL ENDOSCOPY DIAGNOSTIC UNILATERAL OR BILATERAL (AMB ONLY)      3. DNS (deviated nasal septum)  J34.2       4. ETD (Eustachian tube dysfunction), bilateral  H69.93 POCT HEARING/VISION/TYMPANOGRAM (AMB ONLY)     [facility]     [facility]      5. Chronic allergic rhinitis  J30.9         Orders Placed This Encounter    [phone] - NASAL ENDOSCOPY DIAGNOSTIC UNILATERAL OR BILATERAL (AMB ONLY)    [facility]    [facility]    POCT HEARING/VISION/TYMPANOGRAM (AMB ONLY)    Triamcinolone  Acetonide (NASACORT ) 55 mcg Nasal Aerosol, Spray   Will check audiogram  Will start nasacort  for chronic ETD.  Tonsils appear stable.      Follow up:  Return for Follow up after audiogram.    Oneil Arch,  DO

## 2024-11-06 NOTE — Procedures (Signed)
 [facility]  [address]  [place] [address]  Operated by [facility]  Procedure Note    Name: Shirley Hudson MRN:  [id number]   Date: 11/06/2024 DOB:  04/19/1976 (48 y.o.)         31231 - NASAL ENDOSCOPY DIAGNOSTIC UNILATERAL OR BILATERAL (AMB ONLY)    Performed by: Margean Anes, DO  Authorized by: Margean Anes, DO    Time Out:     Immediately before the procedure, a time out was called:  Yes    Patient verified:  Yes    Procedure Verified:  Yes    Site Verified:  Yes  Documentation:      [facility]  [address]  [place] [address]  Operated by [facility]  Procedure Note    Name: Shirley Hudson MRN:  [id number]  Date: 11/06/2024 DOB:  1975-11-23 (48 y.o.)        @PROCDOC @    Indications for procedure: R/O foreign body or mass    Anesthesia: Oxymetazoline nasal spray    Description: Nasal endoscopy with rigid scope was performed with examination of the  septum, inferior, middle, and superior meatus, turbinates, sphenoethmoidal recess, and nasopharynx.     There were no polyps, pus, or granulation tissue noted.  ET orifices and nasopharynx were normal.     Findings: Septal deviation and Allergic rhinitis    The patient tolerated the procedure well.    Anes Margean, DO             Thong Feeny [place], DO

## 2024-11-06 NOTE — Addendum Note (Signed)
 Addended by: Vedder Brittian C on: 11/06/2024 09:42 AM     Modules accepted: Orders

## 2024-11-12 ENCOUNTER — Other Ambulatory Visit (INDEPENDENT_AMBULATORY_CARE_PROVIDER_SITE_OTHER): Payer: Self-pay | Admitting: OTOLARYNGOLOGY

## 2024-11-12 DIAGNOSIS — R42 Dizziness and giddiness: Secondary | ICD-10-CM

## 2024-11-22 ENCOUNTER — Encounter (HOSPITAL_COMMUNITY): Payer: Self-pay

## 2024-11-22 ENCOUNTER — Other Ambulatory Visit (HOSPITAL_COMMUNITY): Payer: Self-pay | Admitting: NURSE PRACTITIONER

## 2024-11-22 DIAGNOSIS — H6990 Unspecified Eustachian tube disorder, unspecified ear: Secondary | ICD-10-CM

## 2024-12-03 ENCOUNTER — Ambulatory Visit

## 2024-12-04 ENCOUNTER — Ambulatory Visit (INDEPENDENT_AMBULATORY_CARE_PROVIDER_SITE_OTHER): Admitting: OTOLARYNGOLOGY

## 2024-12-28 ENCOUNTER — Ambulatory Visit

## 2025-01-03 ENCOUNTER — Ambulatory Visit (INDEPENDENT_AMBULATORY_CARE_PROVIDER_SITE_OTHER): Payer: Self-pay | Admitting: OTOLARYNGOLOGY

## 2025-01-18 ENCOUNTER — Ambulatory Visit (INDEPENDENT_AMBULATORY_CARE_PROVIDER_SITE_OTHER): Payer: Self-pay | Admitting: NURSE PRACTITIONER
